# Patient Record
Sex: Female | Born: 1955 | Race: White | Hispanic: No | Marital: Married | Smoking: Former smoker
Health system: Southern US, Community
[De-identification: ages and names within clinical notes are randomized; demographics above are authoritative.]

## PROBLEM LIST (undated history)

## (undated) DIAGNOSIS — IMO0002 Reserved for concepts with insufficient information to code with codable children: Secondary | ICD-10-CM

## (undated) DIAGNOSIS — N281 Cyst of kidney, acquired: Secondary | ICD-10-CM

## (undated) DIAGNOSIS — S83249A Other tear of medial meniscus, current injury, unspecified knee, initial encounter: Secondary | ICD-10-CM

## (undated) DIAGNOSIS — K449 Diaphragmatic hernia without obstruction or gangrene: Secondary | ICD-10-CM

## (undated) DIAGNOSIS — A64 Unspecified sexually transmitted disease: Secondary | ICD-10-CM

## (undated) HISTORY — DX: Cyst of kidney, acquired: N28.1

## (undated) HISTORY — DX: Other tear of medial meniscus, current injury, unspecified knee, initial encounter: S83.249A

## (undated) HISTORY — DX: Diaphragmatic hernia without obstruction or gangrene: K44.9

## (undated) HISTORY — PX: BREAST CYST EXCISION: SHX579

## (undated) HISTORY — DX: Reserved for concepts with insufficient information to code with codable children: IMO0002

## (undated) HISTORY — DX: Unspecified sexually transmitted disease: A64

---

## 1998-02-03 HISTORY — PX: COLPOSCOPY: SHX161

## 1998-05-03 DIAGNOSIS — IMO0002 Reserved for concepts with insufficient information to code with codable children: Secondary | ICD-10-CM

## 1998-05-03 DIAGNOSIS — R87619 Unspecified abnormal cytological findings in specimens from cervix uteri: Secondary | ICD-10-CM

## 1998-05-03 HISTORY — DX: Reserved for concepts with insufficient information to code with codable children: IMO0002

## 1998-05-03 HISTORY — DX: Unspecified abnormal cytological findings in specimens from cervix uteri: R87.619

## 1999-04-04 ENCOUNTER — Encounter: Payer: Self-pay | Admitting: Obstetrics and Gynecology

## 1999-04-04 ENCOUNTER — Encounter: Admission: RE | Admit: 1999-04-04 | Discharge: 1999-04-04 | Payer: Self-pay | Admitting: Obstetrics and Gynecology

## 2000-04-15 ENCOUNTER — Encounter: Payer: Self-pay | Admitting: Unknown Physician Specialty

## 2000-04-15 ENCOUNTER — Encounter: Admission: RE | Admit: 2000-04-15 | Discharge: 2000-04-15 | Payer: Self-pay | Admitting: Obstetrics and Gynecology

## 2001-04-16 ENCOUNTER — Encounter: Admission: RE | Admit: 2001-04-16 | Discharge: 2001-04-16 | Payer: Self-pay

## 2002-09-15 ENCOUNTER — Encounter: Admission: RE | Admit: 2002-09-15 | Discharge: 2002-09-15 | Payer: Self-pay | Admitting: Unknown Physician Specialty

## 2002-09-15 ENCOUNTER — Encounter: Payer: Self-pay | Admitting: Unknown Physician Specialty

## 2003-09-27 ENCOUNTER — Encounter: Admission: RE | Admit: 2003-09-27 | Discharge: 2003-09-27 | Payer: Self-pay | Admitting: Unknown Physician Specialty

## 2004-10-16 ENCOUNTER — Encounter: Admission: RE | Admit: 2004-10-16 | Discharge: 2004-10-16 | Payer: Self-pay | Admitting: Unknown Physician Specialty

## 2004-12-10 ENCOUNTER — Encounter: Admission: RE | Admit: 2004-12-10 | Discharge: 2004-12-10 | Payer: Self-pay | Admitting: Family Medicine

## 2005-06-18 ENCOUNTER — Encounter: Admission: RE | Admit: 2005-06-18 | Discharge: 2005-06-18 | Payer: Self-pay | Admitting: Unknown Physician Specialty

## 2005-11-05 ENCOUNTER — Encounter: Admission: RE | Admit: 2005-11-05 | Discharge: 2005-11-05 | Payer: Self-pay | Admitting: Unknown Physician Specialty

## 2006-11-18 ENCOUNTER — Encounter: Admission: RE | Admit: 2006-11-18 | Discharge: 2006-11-18 | Payer: Self-pay | Admitting: Obstetrics and Gynecology

## 2006-12-05 DIAGNOSIS — N281 Cyst of kidney, acquired: Secondary | ICD-10-CM

## 2006-12-05 HISTORY — DX: Cyst of kidney, acquired: N28.1

## 2006-12-10 ENCOUNTER — Other Ambulatory Visit: Admission: RE | Admit: 2006-12-10 | Discharge: 2006-12-10 | Payer: Self-pay | Admitting: Obstetrics & Gynecology

## 2006-12-18 ENCOUNTER — Encounter: Admission: RE | Admit: 2006-12-18 | Discharge: 2006-12-18 | Payer: Self-pay | Admitting: Obstetrics and Gynecology

## 2006-12-27 ENCOUNTER — Encounter: Admission: RE | Admit: 2006-12-27 | Discharge: 2006-12-27 | Payer: Self-pay | Admitting: Obstetrics and Gynecology

## 2007-06-30 ENCOUNTER — Encounter: Admission: RE | Admit: 2007-06-30 | Discharge: 2007-06-30 | Payer: Self-pay | Admitting: Urology

## 2007-11-24 ENCOUNTER — Encounter: Admission: RE | Admit: 2007-11-24 | Discharge: 2007-11-24 | Payer: Self-pay | Admitting: Obstetrics and Gynecology

## 2007-12-16 ENCOUNTER — Other Ambulatory Visit: Admission: RE | Admit: 2007-12-16 | Discharge: 2007-12-16 | Payer: Self-pay | Admitting: Obstetrics and Gynecology

## 2008-01-12 ENCOUNTER — Encounter: Admission: RE | Admit: 2008-01-12 | Discharge: 2008-01-12 | Payer: Self-pay | Admitting: Obstetrics and Gynecology

## 2008-06-29 ENCOUNTER — Encounter: Admission: RE | Admit: 2008-06-29 | Discharge: 2008-06-29 | Payer: Self-pay | Admitting: Urology

## 2008-11-24 ENCOUNTER — Encounter: Admission: RE | Admit: 2008-11-24 | Discharge: 2008-11-24 | Payer: Self-pay | Admitting: Obstetrics and Gynecology

## 2009-01-26 ENCOUNTER — Emergency Department (HOSPITAL_BASED_OUTPATIENT_CLINIC_OR_DEPARTMENT_OTHER): Admission: EM | Admit: 2009-01-26 | Discharge: 2009-01-26 | Payer: Self-pay | Admitting: Emergency Medicine

## 2009-01-26 ENCOUNTER — Ambulatory Visit: Payer: Self-pay | Admitting: Diagnostic Radiology

## 2009-02-03 DIAGNOSIS — K449 Diaphragmatic hernia without obstruction or gangrene: Secondary | ICD-10-CM

## 2009-02-03 HISTORY — DX: Diaphragmatic hernia without obstruction or gangrene: K44.9

## 2009-07-23 ENCOUNTER — Encounter: Admission: RE | Admit: 2009-07-23 | Discharge: 2009-07-23 | Payer: Self-pay | Admitting: Urology

## 2009-10-03 ENCOUNTER — Encounter: Admission: RE | Admit: 2009-10-03 | Discharge: 2009-10-03 | Payer: Self-pay | Admitting: Orthopedic Surgery

## 2009-10-10 ENCOUNTER — Encounter: Admission: RE | Admit: 2009-10-10 | Discharge: 2009-10-10 | Payer: Self-pay | Admitting: Orthopedic Surgery

## 2009-10-26 IMAGING — MG MM SCREEN MAMMOGRAM BILATERAL
4 series · 4 of 4 positions shown · non-contrast
Comparison: none

DG SCREEN MAMMOGRAM BILATERAL
Bilateral CC and MLO view(s) were taken.

DIGITAL SCREENING MAMMOGRAM WITH CAD:
There are scattered fibroglandular densities.  No masses or malignant type calcifications are 
identified.  Compared with prior studies.
Images were processed with CAD.

[R CC]
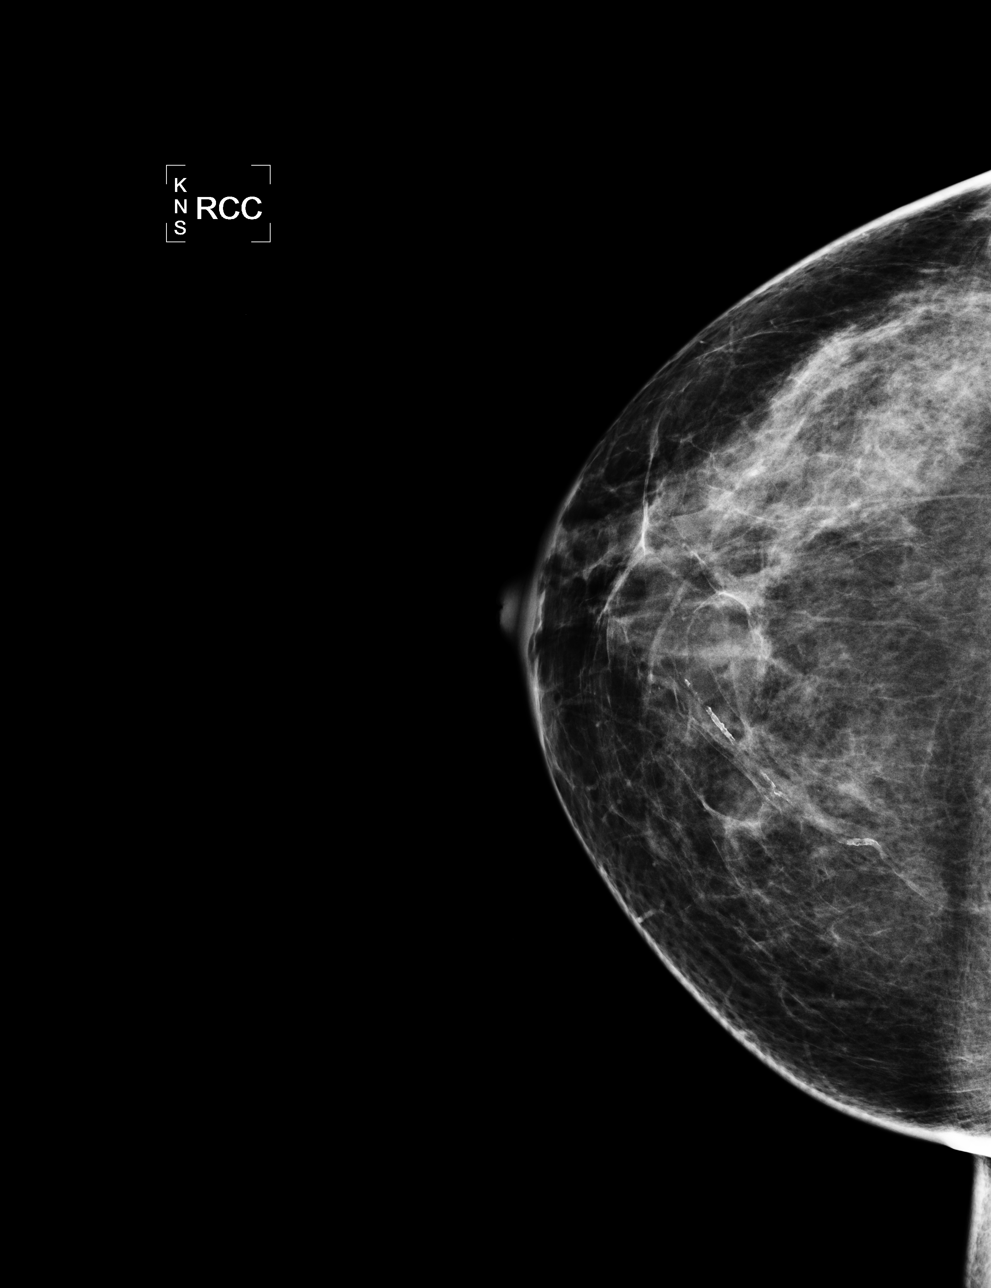

[L CC]
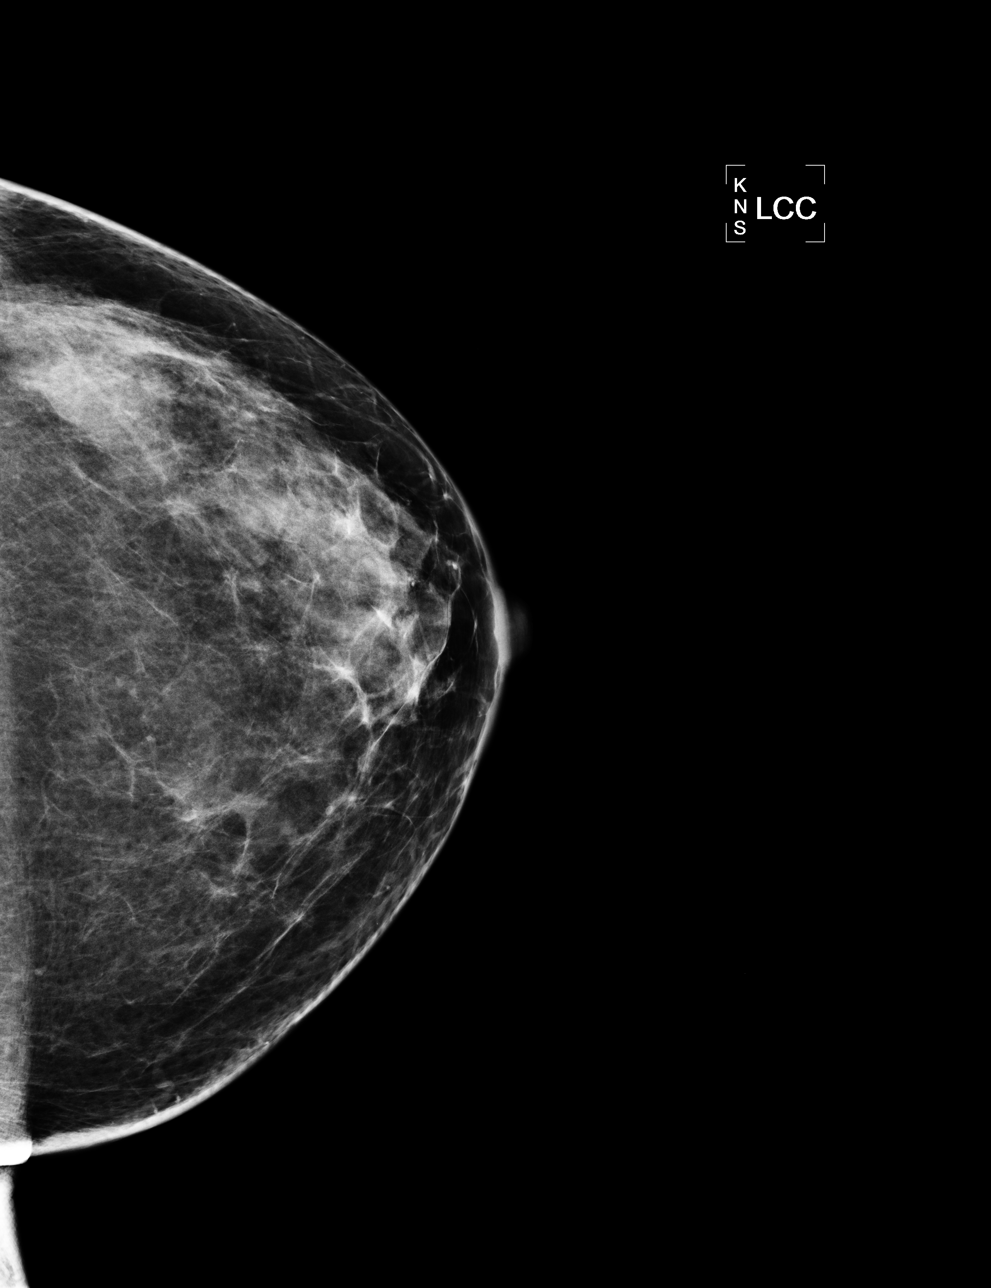

[L MLO]
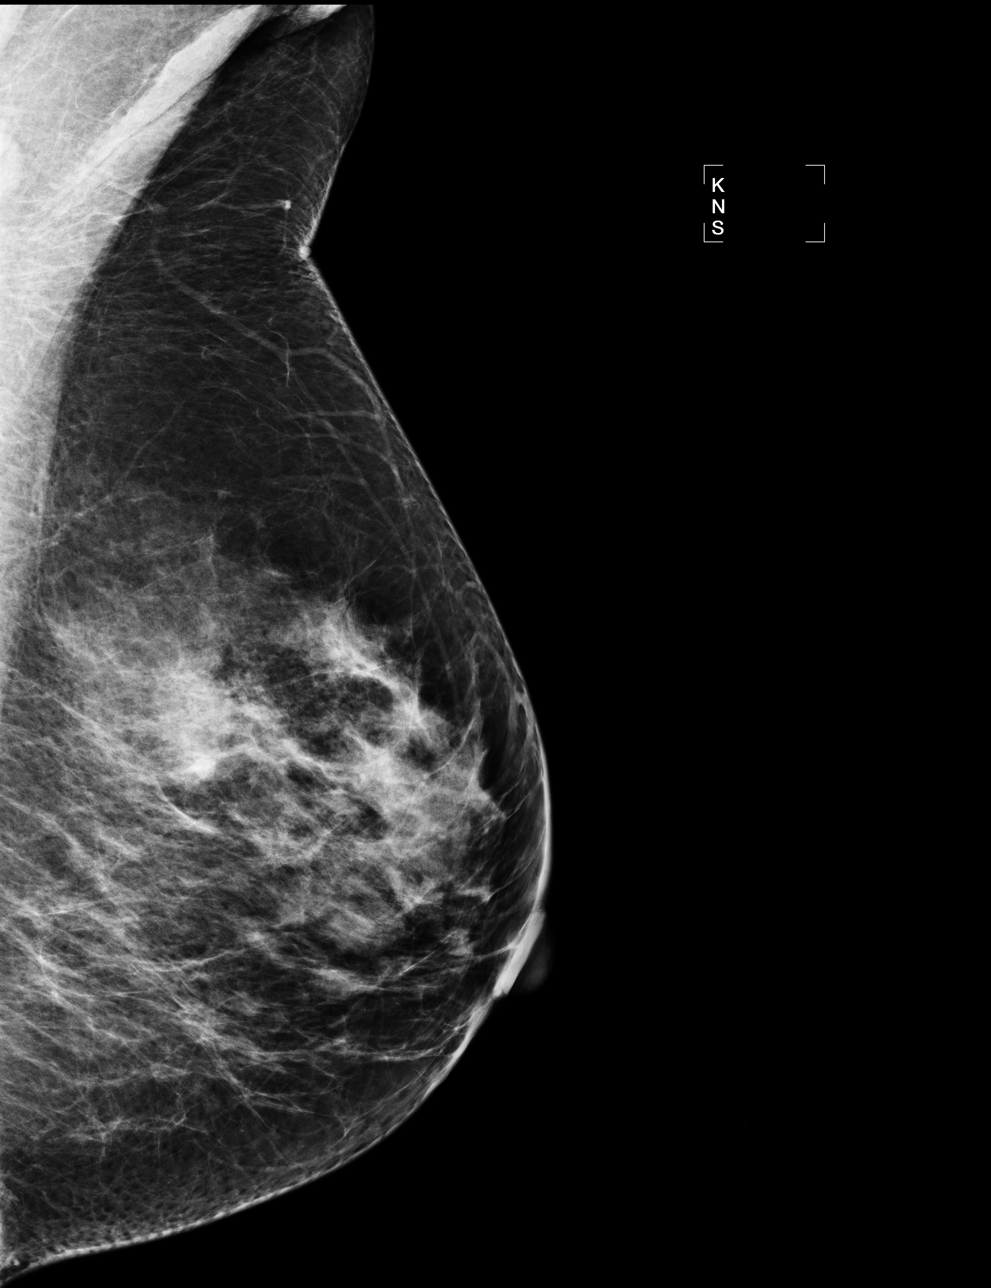

[R MLO]
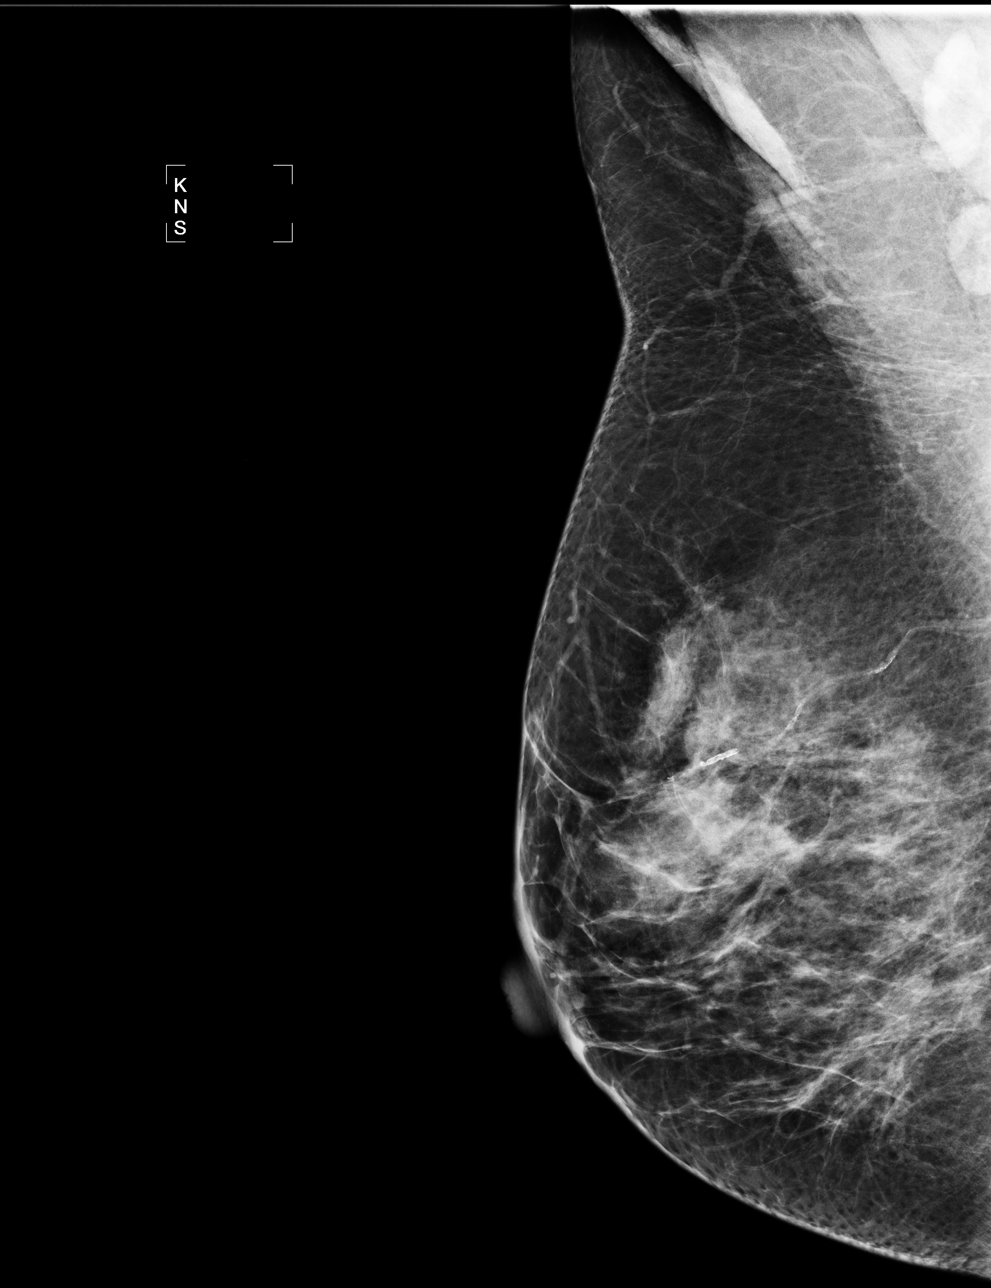

[4 of 4 positions shown; findings below may reference images not displayed]

IMPRESSION: No specific mammographic evidence of malignancy.  Next screening mammogram is recommended in one 
year.

A result letter of this screening mammogram will be mailed directly to the patient.

ASSESSMENT: Negative - BI-RADS 1

Screening mammogram in 1 year.
,

## 2009-10-29 ENCOUNTER — Encounter
Admission: RE | Admit: 2009-10-29 | Discharge: 2009-10-29 | Payer: Self-pay | Source: Home / Self Care | Admitting: Neurosurgery

## 2009-11-26 ENCOUNTER — Encounter
Admission: RE | Admit: 2009-11-26 | Discharge: 2009-11-26 | Payer: Self-pay | Source: Home / Self Care | Admitting: Obstetrics and Gynecology

## 2009-12-26 ENCOUNTER — Encounter
Admission: RE | Admit: 2009-12-26 | Discharge: 2009-12-26 | Payer: Self-pay | Source: Home / Self Care | Admitting: Neurosurgery

## 2010-02-20 ENCOUNTER — Encounter
Admission: RE | Admit: 2010-02-20 | Discharge: 2010-02-20 | Payer: Self-pay | Source: Home / Self Care | Attending: Neurosurgery | Admitting: Neurosurgery

## 2010-06-28 ENCOUNTER — Other Ambulatory Visit: Payer: Self-pay | Admitting: Neurosurgery

## 2010-06-28 DIAGNOSIS — M549 Dorsalgia, unspecified: Secondary | ICD-10-CM

## 2010-07-04 ENCOUNTER — Ambulatory Visit
Admission: RE | Admit: 2010-07-04 | Discharge: 2010-07-04 | Disposition: A | Discharge status: Home / Self Care | Payer: PRIVATE HEALTH INSURANCE | Source: Ambulatory Visit | Attending: Neurosurgery | Admitting: Neurosurgery

## 2010-07-04 ENCOUNTER — Other Ambulatory Visit: Payer: Self-pay | Admitting: Neurosurgery

## 2010-07-04 DIAGNOSIS — M549 Dorsalgia, unspecified: Secondary | ICD-10-CM

## 2010-10-22 ENCOUNTER — Other Ambulatory Visit: Payer: Self-pay | Admitting: Obstetrics and Gynecology

## 2010-10-22 DIAGNOSIS — Z1231 Encounter for screening mammogram for malignant neoplasm of breast: Secondary | ICD-10-CM

## 2010-12-03 ENCOUNTER — Ambulatory Visit
Admission: RE | Admit: 2010-12-03 | Discharge: 2010-12-03 | Disposition: A | Discharge status: Home / Self Care | Payer: PRIVATE HEALTH INSURANCE | Source: Ambulatory Visit | Attending: Obstetrics and Gynecology | Admitting: Obstetrics and Gynecology

## 2010-12-03 DIAGNOSIS — Z1231 Encounter for screening mammogram for malignant neoplasm of breast: Secondary | ICD-10-CM

## 2010-12-25 ENCOUNTER — Other Ambulatory Visit: Payer: Self-pay | Admitting: Neurosurgery

## 2010-12-25 DIAGNOSIS — M47817 Spondylosis without myelopathy or radiculopathy, lumbosacral region: Secondary | ICD-10-CM

## 2011-01-03 ENCOUNTER — Other Ambulatory Visit: Payer: PRIVATE HEALTH INSURANCE

## 2011-01-24 ENCOUNTER — Ambulatory Visit
Admission: RE | Admit: 2011-01-24 | Discharge: 2011-01-24 | Disposition: A | Discharge status: Home / Self Care | Payer: PRIVATE HEALTH INSURANCE | Source: Ambulatory Visit | Attending: Neurosurgery | Admitting: Neurosurgery

## 2011-01-24 ENCOUNTER — Other Ambulatory Visit: Payer: Self-pay | Admitting: Neurosurgery

## 2011-01-24 DIAGNOSIS — M47817 Spondylosis without myelopathy or radiculopathy, lumbosacral region: Secondary | ICD-10-CM

## 2011-01-24 MED ORDER — METHYLPREDNISOLONE ACETATE 40 MG/ML INJ SUSP (RADIOLOG
120.0000 mg | Freq: Once | INTRAMUSCULAR | Status: AC
  Administered 2011-01-24: 120 mg via EPIDURAL

## 2011-01-24 MED ORDER — IOHEXOL 180 MG/ML  SOLN
1.0000 mL | Freq: Once | INTRAMUSCULAR | Status: AC | PRN
  Administered 2011-01-24: 1 mL via EPIDURAL

## 2011-11-03 ENCOUNTER — Other Ambulatory Visit: Payer: Self-pay | Admitting: Certified Nurse Midwife

## 2011-11-03 DIAGNOSIS — Z1231 Encounter for screening mammogram for malignant neoplasm of breast: Secondary | ICD-10-CM

## 2011-12-04 ENCOUNTER — Ambulatory Visit (INDEPENDENT_AMBULATORY_CARE_PROVIDER_SITE_OTHER): Payer: PRIVATE HEALTH INSURANCE

## 2011-12-04 DIAGNOSIS — Z1231 Encounter for screening mammogram for malignant neoplasm of breast: Secondary | ICD-10-CM

## 2012-06-10 ENCOUNTER — Telehealth: Payer: Self-pay | Admitting: Certified Nurse Midwife

## 2012-06-10 NOTE — Telephone Encounter (Signed)
Patient cancel fasting lipid panel appointment for 07/15/12. Patient states she will get this done at her next [facility] 02/08/13. FYI.Marland KitchenMarland Kitchen

## 2012-07-15 ENCOUNTER — Other Ambulatory Visit: Payer: Self-pay

## 2012-08-30 ENCOUNTER — Other Ambulatory Visit: Payer: Self-pay | Admitting: Orthopedic Surgery

## 2012-08-30 DIAGNOSIS — M549 Dorsalgia, unspecified: Secondary | ICD-10-CM

## 2012-08-31 ENCOUNTER — Other Ambulatory Visit: Payer: Self-pay | Admitting: Orthopedic Surgery

## 2012-08-31 ENCOUNTER — Ambulatory Visit
Admission: RE | Admit: 2012-08-31 | Discharge: 2012-08-31 | Disposition: A | Discharge status: Home / Self Care | Payer: PRIVATE HEALTH INSURANCE | Source: Ambulatory Visit | Attending: Orthopedic Surgery | Admitting: Orthopedic Surgery

## 2012-08-31 ENCOUNTER — Inpatient Hospital Stay: Admission: RE | Admit: 2012-08-31 | Payer: PRIVATE HEALTH INSURANCE | Source: Ambulatory Visit

## 2012-08-31 DIAGNOSIS — M549 Dorsalgia, unspecified: Secondary | ICD-10-CM

## 2012-08-31 MED ORDER — IOHEXOL 180 MG/ML  SOLN
1.0000 mL | Freq: Once | INTRAMUSCULAR | Status: AC | PRN
  Administered 2012-08-31: 1 mL via EPIDURAL

## 2012-08-31 MED ORDER — METHYLPREDNISOLONE ACETATE 40 MG/ML INJ SUSP (RADIOLOG
120.0000 mg | Freq: Once | INTRAMUSCULAR | Status: AC
  Administered 2012-08-31: 120 mg via EPIDURAL

## 2012-09-07 ENCOUNTER — Other Ambulatory Visit: Payer: PRIVATE HEALTH INSURANCE

## 2012-10-29 ENCOUNTER — Other Ambulatory Visit: Payer: Self-pay

## 2012-10-29 DIAGNOSIS — Z1231 Encounter for screening mammogram for malignant neoplasm of breast: Secondary | ICD-10-CM

## 2012-12-06 ENCOUNTER — Ambulatory Visit: Payer: Self-pay

## 2012-12-09 ENCOUNTER — Other Ambulatory Visit: Payer: Self-pay

## 2012-12-09 ENCOUNTER — Ambulatory Visit
Admission: RE | Admit: 2012-12-09 | Discharge: 2012-12-09 | Disposition: A | Discharge status: Home / Self Care | Payer: PRIVATE HEALTH INSURANCE | Source: Ambulatory Visit

## 2012-12-09 DIAGNOSIS — Z1231 Encounter for screening mammogram for malignant neoplasm of breast: Secondary | ICD-10-CM

## 2013-02-07 ENCOUNTER — Encounter: Payer: Self-pay | Admitting: Certified Nurse Midwife

## 2013-02-08 ENCOUNTER — Encounter: Payer: Self-pay | Admitting: Certified Nurse Midwife

## 2013-02-08 ENCOUNTER — Ambulatory Visit (INDEPENDENT_AMBULATORY_CARE_PROVIDER_SITE_OTHER): Payer: No Typology Code available for payment source | Admitting: Certified Nurse Midwife

## 2013-02-08 VITALS — BP 111/66 | HR 69 | Resp 16 | Ht 61.5 in | Wt 135.0 lb

## 2013-02-08 DIAGNOSIS — Z0000 Encounter for general adult medical examination without abnormal findings: Secondary | ICD-10-CM

## 2013-02-08 DIAGNOSIS — Z01419 Encounter for gynecological examination (general) (routine) without abnormal findings: Secondary | ICD-10-CM

## 2013-02-08 LAB — COMPREHENSIVE METABOLIC PANEL
ALK PHOS: 67 U/L (ref 39–117)
ALT: 12 U/L (ref 0–35)
AST: 16 U/L (ref 0–37)
Albumin: 4 g/dL (ref 3.5–5.2)
BILIRUBIN TOTAL: 0.4 mg/dL (ref 0.3–1.2)
BUN: 13 mg/dL (ref 6–23)
CO2: 29 meq/L (ref 19–32)
Calcium: 8.9 mg/dL (ref 8.4–10.5)
Chloride: 106 mEq/L (ref 96–112)
Creat: 0.53 mg/dL (ref 0.50–1.10)
GLUCOSE: 86 mg/dL (ref 70–99)
Potassium: 4 mEq/L (ref 3.5–5.3)
Sodium: 142 mEq/L (ref 135–145)
Total Protein: 6.2 g/dL (ref 6.0–8.3)

## 2013-02-08 LAB — POCT URINALYSIS DIPSTICK
BILIRUBIN UA: NEGATIVE
Glucose, UA: NEGATIVE
Ketones, UA: NEGATIVE
LEUKOCYTES UA: NEGATIVE
NITRITE UA: NEGATIVE
PH UA: 5
PROTEIN UA: NEGATIVE
UROBILINOGEN UA: NEGATIVE

## 2013-02-08 LAB — LIPID PANEL
Cholesterol: 221 mg/dL — ABNORMAL HIGH (ref 0–200)
HDL: 56 mg/dL (ref >39)
LDL Cholesterol: 137 mg/dL — ABNORMAL HIGH (ref 0–99)
TRIGLYCERIDES: 139 mg/dL (ref <150)
Total CHOL/HDL Ratio: 3.9 Ratio
VLDL: 28 mg/dL (ref 0–40)

## 2013-02-08 LAB — TSH: TSH: 1.324 u[IU]/mL (ref 0.350–4.500)

## 2013-02-08 LAB — HEMOGLOBIN, FINGERSTICK: Hemoglobin, fingerstick: 13 g/dL (ref 12.0–16.0)

## 2013-02-08 LAB — HEMOGLOBIN A1C
HEMOGLOBIN A1C: 5.7 % — AB (ref <5.7)
MEAN PLASMA GLUCOSE: 117 mg/dL — AB (ref <117)

## 2013-02-08 NOTE — Patient Instructions (Signed)
General topics  Next pap or exam is  due in 1 year Take a Women's multivitamin Take 1200 mg. of calcium daily - prefer dietary If any concerns in interim to call back  Breast Self-Awareness Practicing breast self-awareness may pick up problems early, prevent significant medical complications, and possibly save your life. By practicing breast self-awareness, you can become familiar with how your breasts look and feel and if your breasts are changing. This allows you to notice changes early. It can also offer you some reassurance that your breast health is good. One way to learn what is normal for your breasts and whether your breasts are changing is to do a breast self-exam. If you find a lump or something that was not present in the past, it is best to contact your caregiver right away. Other findings that should be evaluated by your caregiver include nipple discharge, especially if it is bloody; skin changes or reddening; areas where the skin seems to be pulled in (retracted); or new lumps and bumps. Breast pain is seldom associated with cancer (malignancy), but should also be evaluated by a caregiver. BREAST SELF-EXAM The best time to examine your breasts is 5 7 days after your menstrual period is over.  ExitCare Patient Information 2013 [employer].   Exercise to Stay Healthy Exercise helps you become and stay healthy. EXERCISE IDEAS AND TIPS Choose exercises that:  You enjoy.  Fit into your day. You do not need to exercise really hard to be healthy. You can do exercises at a slow or medium level and stay healthy. You can:  Stretch before and after working out.  Try yoga, Pilates, or tai chi.  Lift weights.  Walk fast, swim, jog, run, climb stairs, bicycle, dance, or rollerskate.  Take aerobic classes. Exercises that burn about 150 calories:  Running 1  miles in 15 minutes.  Playing volleyball for 45 to 60 minutes.  Washing and waxing a car for 45 to 60  minutes.  Playing touch football for 45 minutes.  Walking 1  miles in 35 minutes.  Pushing a stroller 1  miles in 30 minutes.  Playing basketball for 30 minutes.  Raking leaves for 30 minutes.  Bicycling 5 miles in 30 minutes.  Walking 2 miles in 30 minutes.  Dancing for 30 minutes.  Shoveling snow for 15 minutes.  Swimming laps for 20 minutes.  Walking up stairs for 15 minutes.  Bicycling 4 miles in 15 minutes.  Gardening for 30 to 45 minutes.  Jumping rope for 15 minutes.  Washing windows or floors for 45 to 60 minutes. Document Released: 02/22/2010 Document Revised: 04/14/2011 Document Reviewed: 02/22/2010 ExitCare Patient Information 2013 ExitCare[employer].   Other topics ( that may be useful information):    Sexually Transmitted Disease Sexually transmitted disease (STD) refers to any infection that is passed from person to person during sexual activity. This may happen by way of saliva, semen, blood, vaginal mucus, or urine. Common STDs include:  Gonorrhea.  Chlamydia.  Syphilis.  HIV/AIDS.  Genital herpes.  Hepatitis B and C.  Trichomonas.  Human papillomavirus (HPV).  Pubic lice. CAUSES  An STD may be spread by bacteria, virus, or parasite. A person can get an STD by:  Sexual intercourse with an infected person.  Sharing sex toys with an infected person.  Sharing needles with an infected person.  Having intimate contact with the genitals, mouth, or rectal areas of an infected person. SYMPTOMS  Some people may not have any symptoms, but   they can still pass the infection to others. Different STDs have different symptoms. Symptoms include:  Painful or bloody urination.  Pain in the pelvis, abdomen, vagina, anus, throat, or eyes.  Skin rash, itching, irritation, growths, or sores (lesions). These usually occur in the genital or anal area.  Abnormal vaginal discharge.  Penile discharge in men.  Soft, flesh-colored skin growths in the  genital or anal area.  Fever.  Pain or bleeding during sexual intercourse.  Swollen glands in the groin area.  Yellow skin and eyes (jaundice). This is seen with hepatitis. DIAGNOSIS  To make a diagnosis, your caregiver may:  Take a medical history.  Perform a physical exam.  Take a specimen (culture) to be examined.  Examine a sample of discharge under a microscope.  Perform blood test TREATMENT   Chlamydia, gonorrhea, trichomonas, and syphilis can be cured with antibiotic medicine.  Genital herpes, hepatitis, and HIV can be treated, but not cured, with prescribed medicines. The medicines will lessen the symptoms.  Genital warts from HPV can be treated with medicine or by freezing, burning (electrocautery), or surgery. Warts may come back.  HPV is a virus and cannot be cured with medicine or surgery.However, abnormal areas may be followed very closely by your caregiver and may be removed from the cervix, vagina, or vulva through office procedures or surgery. If your diagnosis is confirmed, your recent sexual partners need treatment. This is true even if they are symptom-free or have a negative culture or evaluation. They should not have sex until their caregiver says it is okay. HOME CARE INSTRUCTIONS  All sexual partners should be informed, tested, and treated for all STDs.  Take your antibiotics as directed. Finish them even if you start to feel better.  Only take over-the-counter or prescription medicines for pain, discomfort, or fever as directed by your caregiver.  Rest.  Eat a balanced diet and drink enough fluids to keep your urine clear or pale yellow.  Do not have sex until treatment is completed and you have followed up with your caregiver. STDs should be checked after treatment.  Keep all follow-up appointments, Pap tests, and blood tests as directed by your caregiver.  Only use latex condoms and water-soluble lubricants during sexual activity. Do not use  petroleum jelly or oils.  Avoid alcohol and illegal drugs.  Get vaccinated for HPV and hepatitis. If you have not received these vaccines in the past, talk to your caregiver about whether one or both might be right for you.  Avoid risky sex practices that can break the skin. The only way to avoid getting an STD is to avoid all sexual activity.Latex condoms and dental dams (for oral sex) will help lessen the risk of getting an STD, but will not completely eliminate the risk. SEEK MEDICAL CARE IF:   You have a fever.  You have any new or worsening symptoms. Document Released: 04/12/2002 Document Revised: 04/14/2011 Document Reviewed: 04/19/2010 [facility] Patient Information 2013 [employer].    Domestic Abuse You are being battered or abused if someone close to you hits, pushes, or physically hurts you in any way. You also are being abused if you are forced into activities. You are being sexually abused if you are forced to have sexual contact of any kind. You are being emotionally abused if you are made to feel worthless or if you are constantly threatened. It is important to remember that help is available. No one has the right to abuse you. PREVENTION OF FURTHER  ABUSE  Learn the warning signs of danger. This varies with situations but may include: the use of alcohol, threats, isolation from friends and family, or forced sexual contact. Leave if you feel that violence is going to occur.  If you are attacked or beaten, report it to the police so the abuse is documented. You do not have to press charges. The police can protect you while you or the attackers are leaving. Get the officer's name and badge number and a copy of the report.  Find someone you can trust and tell them what is happening to you: your caregiver, a nurse, clergy member, close friend or family member. Feeling ashamed is natural, but remember that you have done nothing wrong. No one deserves abuse. Document Released:  01/18/2000 Document Revised: 04/14/2011 Document Reviewed: 03/28/2010 ExitCare Patient Information 2013 [employer].    How Much is Too Much Alcohol? Drinking too much alcohol can cause injury, accidents, and health problems. These types of problems can include:   Car crashes.  Falls.  Family fighting (domestic violence).  Drowning.  Fights.  Injuries.  Burns.  Damage to certain organs.  Having a baby with birth defects. ONE DRINK CAN BE TOO MUCH WHEN YOU ARE:  Working.  Pregnant or breastfeeding.  Taking medicines. Ask your doctor.  Driving or planning to drive. If you or someone you know has a drinking problem, get help from a doctor.  Document Released: 11/16/2008 Document Revised: 04/14/2011 Document Reviewed: 11/16/2008 ExitCare Patient Information 2013 [employer].   Smoking Hazards Smoking cigarettes is extremely bad for your health. Tobacco smoke has over 200 known poisons in it. There are over 60 chemicals in tobacco smoke that cause cancer. Some of the chemicals found in cigarette smoke include:   Cyanide.  Benzene.  Formaldehyde.  Methanol (wood alcohol).  Acetylene (fuel used in welding torches).  Ammonia. Cigarette smoke also contains the poisonous gases nitrogen oxide and carbon monoxide.  Cigarette smokers have an increased risk of many serious medical problems and Smoking causes approximately:  90% of all lung cancer deaths in men.  80% of all lung cancer deaths in women.  90% of deaths from chronic obstructive lung disease. Compared with nonsmokers, smoking increases the risk of:  Coronary heart disease by 2 to 4 times.  Stroke by 2 to 4 times.  Men developing lung cancer by 23 times.  Women developing lung cancer by 13 times.  Dying from chronic obstructive lung diseases by 12 times.  . Smoking is the most preventable cause of death and disease in our society.  WHY IS SMOKING ADDICTIVE?  Nicotine is the chemical  agent in tobacco that is capable of causing addiction or dependence.  When you smoke and inhale, nicotine is absorbed rapidly into the bloodstream through your lungs. Nicotine absorbed through the lungs is capable of creating a powerful addiction. Both inhaled and non-inhaled nicotine may be addictive.  Addiction studies of cigarettes and spit tobacco show that addiction to nicotine occurs mainly during the teen years, when young people begin using tobacco products. WHAT ARE THE BENEFITS OF QUITTING?  There are many health benefits to quitting smoking.   Likelihood of developing cancer and heart disease decreases. Health improvements are seen almost immediately.  Blood pressure, pulse rate, and breathing patterns start returning to normal soon after quitting. QUITTING SMOKING   American Lung Association - 1-800-LUNGUSA  American Cancer Society - 1-800-ACS-2345 Document Released: 02/28/2004 Document Revised: 04/14/2011 Document Reviewed: 11/01/2008 ExitCare Patient Information 2013 ExitCare,   LLC.   Stress Management Stress is a state of physical or mental tension that often results from changes in your life or normal routine. Some common causes of stress are:  Death of a loved one.  Injuries or severe illnesses.  Getting fired or changing jobs.  Moving into a new home. Other causes may be:  Sexual problems.  Business or financial losses.  Taking on a large debt.  Regular conflict with someone at home or at work.  Constant tiredness from lack of sleep. It is not just bad things that are stressful. It may be stressful to:  Win the lottery.  Get married.  Buy a new car. The amount of stress that can be easily tolerated varies from person to person. Changes generally cause stress, regardless of the types of change. Too much stress can affect your health. It may lead to physical or emotional problems. Too little stress (boredom) may also become stressful. SUGGESTIONS TO  REDUCE STRESS:  Talk things over with your family and friends. It often is helpful to share your concerns and worries. If you feel your problem is serious, you may want to get help from a professional counselor.  Consider your problems one at a time instead of lumping them all together. Trying to take care of everything at once may seem impossible. List all the things you need to do and then start with the most important one. Set a goal to accomplish 2 or 3 things each day. If you expect to do too many in a single day you will naturally fail, causing you to feel even more stressed.  Do not use alcohol or drugs to relieve stress. Although you may feel better for a short time, they do not remove the problems that caused the stress. They can also be habit forming.  Exercise regularly - at least 3 times per week. Physical exercise can help to relieve that "uptight" feeling and will relax you.  The shortest distance between despair and hope is often a good night's sleep.  Go to bed and get up on time allowing yourself time for appointments without being rushed.  Take a short "time-out" period from any stressful situation that occurs during the day. Close your eyes and take some deep breaths. Starting with the muscles in your face, tense them, hold it for a few seconds, then relax. Repeat this with the muscles in your neck, shoulders, hand, stomach, back and legs.  Take good care of yourself. Eat a balanced diet and get plenty of rest.  Schedule time for having fun. Take a break from your daily routine to relax. HOME CARE INSTRUCTIONS   Call if you feel overwhelmed by your problems and feel you can no longer manage them on your own.  Return immediately if you feel like hurting yourself or someone else. Document Released: 07/16/2000 Document Revised: 04/14/2011 Document Reviewed: 03/08/2007 ExitCare Patient Information 2013 ExitCare, [employer].   

## 2013-02-08 NOTE — Progress Notes (Signed)
58 y.o. G35P1001 Married Caucasian Fe here for annual exam.  Menopausal no HRT. Denies vaginal bleeding or vaginal dryness. Uses Olive Oil if needed. Sees urgent care if needed. No health issues today. Son at [facility] with a 4.0 this year so far!  Patient's last menstrual period was 02/04/2004.          Sexually active: yes  The current method of family planning is vasectomy.    Exercising: no  exercising Smoker:  no  Health Maintenance: Pap:  01-06-12 neg HPV HR neg MMG:  12-09-12 normal Colonoscopy:  11/08 neg BMD:   2009 TDaP:  2007 Labs: Poct urine-rbc tr, Hgb-13.0 Self breast exam: done occ   reports that she has quit smoking. She does not have any smokeless tobacco history on file. She reports that she drinks about 1.0 ounces of alcohol per week. She reports that she does not use illicit drugs.  Past Medical History  Diagnosis Date  . Abnormal Pap smear 05/03/98    ASCUS  . STD (sexually transmitted disease)     HSV2  . Kidney cysts 11/08    no f/u needed (left side)  . Hiatal hernia 1/11    Past Surgical History  Procedure Laterality Date  . Colposcopy  2000    atypia, no dysplasia  . Breast cyst excision Right     benign (fibroadenoma per pt)    Current Outpatient Prescriptions  Medication Sig Dispense Refill  . Vitamin D, Ergocalciferol, (DRISDOL) 50000 UNITS CAPS capsule Take 50,000 Units by mouth every 14 (fourteen) days.       No current facility-administered medications for this visit.    Family History  Problem Relation Age of Onset  . Hypertension Father   . Stroke Father     ROS:  Pertinent items are noted in HPI.  Otherwise, a comprehensive ROS was negative.  Exam:   BP 111/66  Pulse 69  Resp 16  Ht 5' 1.5" (1.562 m)  Wt 135 lb (61.236 kg)  BMI 25.10 kg/m2  LMP 02/04/2004 Height: 5' 1.5" (156.2 cm)  Ht Readings from Last 3 Encounters:  02/08/13 5' 1.5" (1.562 m)    General appearance: alert, cooperative and appears stated  age Head: Normocephalic, without obvious abnormality, atraumatic Neck: no adenopathy, supple, symmetrical, trachea midline and thyroid normal to inspection and palpation and non-palpable Lungs: clear to auscultation bilaterally Breasts: normal appearance, no masses or tenderness, No nipple retraction or dimpling, No nipple discharge or bleeding, No axillary or supraclavicular adenopathy Heart: regular rate and rhythm Abdomen: soft, non-tender; no masses,  no organomegaly Extremities: extremities normal, atraumatic, no cyanosis or edema Skin: Skin color, texture, turgor normal. No rashes or lesions Lymph nodes: Cervical, supraclavicular, and axillary nodes normal. No abnormal inguinal nodes palpated Neurologic: Grossly normal   Pelvic: External genitalia:  no lesions              Urethra:  normal appearing urethra with no masses, tenderness or lesions              Bartholin's and Skene's: normal                 Vagina: normal appearing vagina with normal color and discharge, no lesions              Cervix: normal, non tender              Pap taken: no Bimanual Exam:  Uterus:  normal size, contour, position, consistency, mobility, non-tender and anteflexed  Adnexa: normal adnexa and no mass, fullness, tenderness               Rectovaginal: Confirms               Anus:  normal sphincter tone, no lesions  A:  Well Woman with normal exam  Menopausal no HRT  History of kidney cyst released from follow up  Fasting labs  P:   Reviewed health and wellness pertinent to exam  Aware of need to evaluate if vaginal bleeding  Patient will return to urology if changes  Labs: CMP, Lipid panel, TSH, Hgb A1-c  Pap smear as per guidelines   Mammogram yearly pap smear not taken today  counseled on breast self exam, mammography screening, adequate intake of calcium and vitamin D, diet and exercise, Kegel's exercises  return annually or prn  An After Visit Summary was printed and given  to the patient.

## 2013-02-08 NOTE — Progress Notes (Signed)
Reviewed personally.  M. Suzanne Khalessi Blough, MD.  

## 2013-02-10 ENCOUNTER — Encounter: Payer: Self-pay | Admitting: Certified Nurse Midwife

## 2013-02-10 ENCOUNTER — Other Ambulatory Visit: Payer: Self-pay | Admitting: Certified Nurse Midwife

## 2013-02-10 DIAGNOSIS — R6889 Other general symptoms and signs: Secondary | ICD-10-CM

## 2013-02-23 LAB — FECAL OCCULT BLOOD, IMMUNOCHEMICAL: IFOBT: NEGATIVE

## 2013-05-09 ENCOUNTER — Telehealth: Payer: Self-pay | Admitting: Certified Nurse Midwife

## 2013-05-09 NOTE — Telephone Encounter (Signed)
Patient canceled upcoming lab appointment 05/26/13. Patient says she does not wish to reschedule.

## 2013-05-10 NOTE — Telephone Encounter (Signed)
As long as patient is aware. Will close encounter

## 2013-05-10 NOTE — Telephone Encounter (Signed)
Pt states she is not going to have it checked until January. I explained the risk to patient & why we wanted it checked it 3 mths & pt still says that even if she was told that she needed medication that she would not take it & she wants to take charge of it herself & work on decreasing it & doesn't want it checked until January of 2016

## 2013-05-10 NOTE — Telephone Encounter (Signed)
Please call patient and see if she is having this followed elsewhere? Or just needs to reschedule Her level was borderline high

## 2013-05-26 ENCOUNTER — Other Ambulatory Visit: Payer: No Typology Code available for payment source

## 2013-08-10 ENCOUNTER — Other Ambulatory Visit: Payer: Self-pay | Admitting: Orthopedic Surgery

## 2013-08-10 DIAGNOSIS — M5126 Other intervertebral disc displacement, lumbar region: Secondary | ICD-10-CM

## 2013-08-15 ENCOUNTER — Other Ambulatory Visit: Payer: No Typology Code available for payment source

## 2013-08-25 ENCOUNTER — Other Ambulatory Visit: Payer: Self-pay | Admitting: Orthopedic Surgery

## 2013-08-25 ENCOUNTER — Other Ambulatory Visit: Payer: No Typology Code available for payment source

## 2013-08-25 ENCOUNTER — Ambulatory Visit
Admission: RE | Admit: 2013-08-25 | Discharge: 2013-08-25 | Disposition: A | Discharge status: Home / Self Care | Payer: PRIVATE HEALTH INSURANCE | Source: Ambulatory Visit | Attending: Orthopedic Surgery | Admitting: Orthopedic Surgery

## 2013-08-25 DIAGNOSIS — M5126 Other intervertebral disc displacement, lumbar region: Secondary | ICD-10-CM

## 2013-08-25 MED ORDER — METHYLPREDNISOLONE ACETATE 40 MG/ML INJ SUSP (RADIOLOG
120.0000 mg | Freq: Once | INTRAMUSCULAR | Status: AC
  Administered 2013-08-25: 120 mg via EPIDURAL

## 2013-08-25 MED ORDER — IOHEXOL 180 MG/ML  SOLN
1.0000 mL | Freq: Once | INTRAMUSCULAR | Status: AC | PRN
  Administered 2013-08-25: 1 mL via EPIDURAL

## 2013-08-25 NOTE — Discharge Instructions (Signed)

## 2013-10-20 ENCOUNTER — Other Ambulatory Visit: Payer: Self-pay

## 2013-10-20 DIAGNOSIS — Z1231 Encounter for screening mammogram for malignant neoplasm of breast: Secondary | ICD-10-CM

## 2013-12-05 ENCOUNTER — Encounter: Payer: Self-pay | Admitting: Certified Nurse Midwife

## 2013-12-16 ENCOUNTER — Ambulatory Visit: Payer: PRIVATE HEALTH INSURANCE

## 2013-12-19 ENCOUNTER — Ambulatory Visit: Payer: PRIVATE HEALTH INSURANCE

## 2013-12-19 ENCOUNTER — Ambulatory Visit
Admission: RE | Admit: 2013-12-19 | Discharge: 2013-12-19 | Disposition: A | Discharge status: Home / Self Care | Payer: PRIVATE HEALTH INSURANCE | Source: Ambulatory Visit

## 2013-12-19 ENCOUNTER — Other Ambulatory Visit: Payer: Self-pay

## 2013-12-19 DIAGNOSIS — Z1231 Encounter for screening mammogram for malignant neoplasm of breast: Secondary | ICD-10-CM

## 2014-02-17 ENCOUNTER — Ambulatory Visit (INDEPENDENT_AMBULATORY_CARE_PROVIDER_SITE_OTHER): Payer: No Typology Code available for payment source | Admitting: Certified Nurse Midwife

## 2014-02-17 ENCOUNTER — Encounter: Payer: Self-pay | Admitting: Certified Nurse Midwife

## 2014-02-17 VITALS — BP 104/70 | HR 76 | Resp 16 | Ht 61.5 in | Wt 135.0 lb

## 2014-02-17 DIAGNOSIS — Z0000 Encounter for general adult medical examination without abnormal findings: Secondary | ICD-10-CM

## 2014-02-17 DIAGNOSIS — Z124 Encounter for screening for malignant neoplasm of cervix: Secondary | ICD-10-CM

## 2014-02-17 DIAGNOSIS — Z01419 Encounter for gynecological examination (general) (routine) without abnormal findings: Secondary | ICD-10-CM

## 2014-02-17 LAB — LIPID PANEL
CHOL/HDL RATIO: 3.5 ratio
CHOLESTEROL: 221 mg/dL — AB (ref 0–200)
HDL: 63 mg/dL (ref >39)
LDL CALC: 138 mg/dL — AB (ref 0–99)
TRIGLYCERIDES: 99 mg/dL (ref <150)
VLDL: 20 mg/dL (ref 0–40)

## 2014-02-17 LAB — POCT URINALYSIS DIPSTICK
BILIRUBIN UA: NEGATIVE
GLUCOSE UA: NEGATIVE
KETONES UA: NEGATIVE
Leukocytes, UA: NEGATIVE
NITRITE UA: NEGATIVE
PROTEIN UA: NEGATIVE
RBC UA: NEGATIVE
UROBILINOGEN UA: NEGATIVE
pH, UA: 5

## 2014-02-17 LAB — CBC
HCT: 41.6 % (ref 36.0–46.0)
HEMOGLOBIN: 13.6 g/dL (ref 12.0–15.0)
MCH: 31.3 pg (ref 26.0–34.0)
MCHC: 32.7 g/dL (ref 30.0–36.0)
MCV: 95.6 fL (ref 78.0–100.0)
MPV: 9.1 fL (ref 8.6–12.4)
PLATELETS: 289 10*3/uL (ref 150–400)
RBC: 4.35 MIL/uL (ref 3.87–5.11)
RDW: 12.9 % (ref 11.5–15.5)
WBC: 6 10*3/uL (ref 4.0–10.5)

## 2014-02-17 LAB — COMPREHENSIVE METABOLIC PANEL
ALT: 16 U/L (ref 0–35)
AST: 19 U/L (ref 0–37)
Albumin: 4.2 g/dL (ref 3.5–5.2)
Alkaline Phosphatase: 63 U/L (ref 39–117)
BILIRUBIN TOTAL: 0.4 mg/dL (ref 0.2–1.2)
BUN: 15 mg/dL (ref 6–23)
CALCIUM: 9.7 mg/dL (ref 8.4–10.5)
CHLORIDE: 104 meq/L (ref 96–112)
CO2: 28 meq/L (ref 19–32)
Creat: 0.56 mg/dL (ref 0.50–1.10)
Glucose, Bld: 77 mg/dL (ref 70–99)
Potassium: 3.9 mEq/L (ref 3.5–5.3)
SODIUM: 142 meq/L (ref 135–145)
Total Protein: 6.8 g/dL (ref 6.0–8.3)

## 2014-02-17 LAB — TSH: TSH: 1.088 u[IU]/mL (ref 0.350–4.500)

## 2014-02-17 LAB — HEMOGLOBIN A1C
Hgb A1c MFr Bld: 5.4 % (ref <5.7)
MEAN PLASMA GLUCOSE: 108 mg/dL (ref <117)

## 2014-02-17 NOTE — Progress Notes (Signed)
Reviewed personally.  M. Suzanne Bently Morath, MD.  

## 2014-02-17 NOTE — Patient Instructions (Signed)

## 2014-02-17 NOTE — Progress Notes (Signed)
59 y.o. G38P1011 Married  Caucasian Fe here for annual exam. Menopausal no HRT. Denies vaginal bleeding. Occasional vaginal dryness. Sees Urgent Care if needed. Fasted for labs today. No health issues today.  Patient's last menstrual period was 02/04/2004.          Sexually active: Yes.    The current method of family planning is vasectomy.    Exercising: No.  exercise Smoker:  no  Health Maintenance: Pap:  01-06-12 neg HPV HR neg MMG:  12-19-13 category c density,birads 1:neg Colonoscopy:  11/08 neg BMD:   2009 TDaP:  2007 Labs:poct urine-neg Self breast exam: done occ   reports that she has quit smoking. She does not have any smokeless tobacco history on file. She reports that she drinks about 1.8 - 2.4 oz of alcohol per week. She reports that she does not use illicit drugs.  Past Medical History  Diagnosis Date  . Abnormal Pap smear 05/03/98    ASCUS  . STD (sexually transmitted disease)     HSV2  . Kidney cysts 11/08    no f/u needed (left side)  . Hiatal hernia 1/11    Past Surgical History  Procedure Laterality Date  . Colposcopy  2000    atypia, no dysplasia  . Breast cyst excision Right     benign (fibroadenoma per pt)    Current Outpatient Prescriptions  Medication Sig Dispense Refill  . Cholecalciferol (VITAMIN D PO) Take 1,000 Int'l Units by mouth daily.     . Multiple Vitamins-Minerals (MULTIVITAMIN PO) Take by mouth daily.     No current facility-administered medications for this visit.    Family History  Problem Relation Age of Onset  . Hypertension Father   . Stroke Father     ROS:  Pertinent items are noted in HPI.  Otherwise, a comprehensive ROS was negative.  Exam:   BP 104/70 mmHg  Pulse 76  Resp 16  Ht 5' 1.5" (1.562 m)  Wt 135 lb (61.236 kg)  BMI 25.10 kg/m2  LMP 02/04/2004 Height: 5' 1.5" (156.2 cm) Ht Readings from Last 3 Encounters:  02/17/14 5' 1.5" (1.562 m)  02/08/13 5' 1.5" (1.562 m)    General appearance: alert, cooperative  and appears stated age Head: Normocephalic, without obvious abnormality, atraumatic Neck: no adenopathy, supple, symmetrical, trachea midline and thyroid normal to inspection and palpation Lungs: clear to auscultation bilaterally Breasts: normal appearance, no masses or tenderness, No nipple retraction or dimpling, No nipple discharge or bleeding, No axillary or supraclavicular adenopathy Heart: regular rate and rhythm Abdomen: soft, non-tender; no masses,  no organomegaly Extremities: extremities normal, atraumatic, no cyanosis or edema Skin: Skin color, texture, turgor normal. No rashes or lesions Lymph nodes: Cervical, supraclavicular, and axillary nodes normal. No abnormal inguinal nodes palpated Neurologic: Grossly normal   Pelvic: External genitalia:  no lesions              Urethra:  normal appearing urethra with no masses, tenderness or lesions              Bartholin's and Skene's: normal                 Vagina: normal appearing vagina with normal color and discharge, no lesions              Cervix: normal,non tender, no lesions              Pap taken: Yes.   Bimanual Exam:  Uterus:  normal size, contour, position, consistency,  mobility, non-tender and anteverted              Adnexa: normal adnexa and no mass, fullness, tenderness               Rectovaginal: Confirms               Anus:  normal sphincter tone, no lesions  Chaperone present: Yes  A:  Well Woman with normal exam  Menopausal  No HRT  Fasting labs  P:   Reviewed health and wellness pertinent to exam  Aware of need to advise  If vaginal bleeding  Pap smear taken today with HPV reflex  Labs: CBC, Vit.D, TSH, Lipid panel, CMP, Hgb A1-c   counseled on breast self exam, mammography screening, adequate intake of calcium and vitamin D, diet and exercise  return annually or prn  An After Visit Summary was printed and given to the patient.

## 2014-02-18 LAB — VITAMIN D 25 HYDROXY (VIT D DEFICIENCY, FRACTURES): VIT D 25 HYDROXY: 36 ng/mL (ref 30–100)

## 2014-02-21 LAB — IPS PAP TEST WITH REFLEX TO HPV

## 2014-04-03 ENCOUNTER — Telehealth: Payer: Self-pay | Admitting: Certified Nurse Midwife

## 2014-04-03 NOTE — Telephone Encounter (Signed)
Debbi, is this something you would prescribe?

## 2014-04-03 NOTE — Telephone Encounter (Signed)
Patient calling to request "four alprazolam tablets altogether at the lowest dose." She is taking a flight 04/10/14 "for the first time in 20 years" and said she is "nervous."  [employer]  [phone]

## 2014-04-04 MED ORDER — ALPRAZOLAM 0.5 MG PO TABS: ORAL_TABLET | ORAL | Status: DC

## 2014-04-04 NOTE — Telephone Encounter (Signed)
Rx printed, signed by Ms. Debbie and faxed to [place] , patient is aware.

## 2014-04-04 NOTE — Telephone Encounter (Signed)
Rx printed for signature. 

## 2014-04-04 NOTE — Telephone Encounter (Signed)
Ok to do Rx Xanax 0.5mg  one prior to flight, prn for travel  Disp: # 10

## 2014-11-08 ENCOUNTER — Other Ambulatory Visit: Payer: Self-pay

## 2014-11-08 DIAGNOSIS — Z1231 Encounter for screening mammogram for malignant neoplasm of breast: Secondary | ICD-10-CM

## 2014-12-25 ENCOUNTER — Ambulatory Visit
Admission: RE | Admit: 2014-12-25 | Discharge: 2014-12-25 | Disposition: A | Discharge status: Home / Self Care | Payer: PRIVATE HEALTH INSURANCE | Source: Ambulatory Visit

## 2014-12-25 DIAGNOSIS — Z1231 Encounter for screening mammogram for malignant neoplasm of breast: Secondary | ICD-10-CM

## 2015-02-22 ENCOUNTER — Ambulatory Visit: Payer: No Typology Code available for payment source | Admitting: Certified Nurse Midwife

## 2015-02-23 ENCOUNTER — Encounter: Payer: Self-pay | Admitting: Certified Nurse Midwife

## 2015-02-23 ENCOUNTER — Ambulatory Visit (INDEPENDENT_AMBULATORY_CARE_PROVIDER_SITE_OTHER): Payer: No Typology Code available for payment source | Admitting: Certified Nurse Midwife

## 2015-02-23 VITALS — BP 102/68 | HR 68 | Resp 16 | Ht 61.75 in | Wt 132.0 lb

## 2015-02-23 DIAGNOSIS — Z0000 Encounter for general adult medical examination without abnormal findings: Secondary | ICD-10-CM | POA: Diagnosis not present

## 2015-02-23 DIAGNOSIS — Z01419 Encounter for gynecological examination (general) (routine) without abnormal findings: Secondary | ICD-10-CM | POA: Diagnosis not present

## 2015-02-23 DIAGNOSIS — F410 Panic disorder [episodic paroxysmal anxiety] without agoraphobia: Secondary | ICD-10-CM

## 2015-02-23 LAB — LIPID PANEL
CHOL/HDL RATIO: 3.5 ratio (ref <=5.0)
CHOLESTEROL: 214 mg/dL — AB (ref 125–200)
HDL: 61 mg/dL (ref >=46)
LDL Cholesterol: 135 mg/dL — ABNORMAL HIGH (ref <130)
TRIGLYCERIDES: 92 mg/dL (ref <150)
VLDL: 18 mg/dL (ref <30)

## 2015-02-23 LAB — POCT URINALYSIS DIPSTICK
BILIRUBIN UA: NEGATIVE
GLUCOSE UA: NEGATIVE
KETONES UA: NEGATIVE
Leukocytes, UA: NEGATIVE
Nitrite, UA: NEGATIVE
Protein, UA: NEGATIVE
RBC UA: NEGATIVE
UROBILINOGEN UA: NEGATIVE
pH, UA: 5

## 2015-02-23 LAB — CBC
HEMATOCRIT: 39.5 % (ref 36.0–46.0)
HEMOGLOBIN: 13 g/dL (ref 12.0–15.0)
MCH: 31.6 pg (ref 26.0–34.0)
MCHC: 32.9 g/dL (ref 30.0–36.0)
MCV: 95.9 fL (ref 78.0–100.0)
MPV: 8.9 fL (ref 8.6–12.4)
Platelets: 295 10*3/uL (ref 150–400)
RBC: 4.12 MIL/uL (ref 3.87–5.11)
RDW: 12.7 % (ref 11.5–15.5)
WBC: 5.2 10*3/uL (ref 4.0–10.5)

## 2015-02-23 LAB — HEMOGLOBIN, FINGERSTICK: HEMOGLOBIN, FINGERSTICK: 12.6 g/dL (ref 12.0–16.0)

## 2015-02-23 LAB — THYROID PANEL WITH TSH
Free Thyroxine Index: 3.2 (ref 1.4–3.8)
T3 Uptake: 33 % (ref 22–35)
T4 TOTAL: 9.7 ug/dL (ref 4.5–12.0)
TSH: 0.903 u[IU]/mL (ref 0.350–4.500)

## 2015-02-23 MED ORDER — ALPRAZOLAM 0.5 MG PO TABS: ORAL_TABLET | ORAL | Status: DC

## 2015-02-23 NOTE — Patient Instructions (Signed)

## 2015-02-23 NOTE — Progress Notes (Signed)
60 y.o. G82P1011 Married  Caucasian Fe here for annual exam. Denies vaginal bleeding. Some vaginal dryness with sexual activity now.Using OTC lubricant with fair results. Sees PCP prn. Staying very busy with work with increased work hours now at 24. 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 Considering retirement or a "fun job". Complaining of increase heat and cold intolerance, no change in hair or skin or nails. Some fatigue, but feel work related. Eats well. No other health concerns today. Fasted for screening labs. Will junior in college now!  Patient's last menstrual period was 02/04/2004.          Sexually active: Yes.    The current method of family planning is vasectomy.    Exercising: No.  exercise Smoker:  no  Health Maintenance: Pap:  02-17-14 neg MMG:  12-25-14 category b density,birads 1:neg Colonoscopy:  11/08 neg, pt declines further colonoscopy BMD:   2009 TDaP:  2007 Shingles: 11-12-10 Pneumonia: no Hep C and HIV: not done Labs: poct urine-neg, hgb-12.6 Self breast exam: done occ   reports that she has quit smoking. She does not have any smokeless tobacco history on file. She reports that she drinks about 3.0 oz of alcohol per week. She reports that she does not use illicit drugs.  Past Medical History  Diagnosis Date  . Abnormal Pap smear 05/03/98    ASCUS  . STD (sexually transmitted disease)     HSV2  . Kidney cysts 11/08    no f/u needed (left side)  . Hiatal hernia 1/11    Past Surgical History  Procedure Laterality Date  . Colposcopy  2000    atypia, no dysplasia  . Breast cyst excision Right     benign (fibroadenoma per pt)    Current Outpatient Prescriptions  Medication Sig Dispense Refill  . Cholecalciferol (VITAMIN D PO) Take 1,000 Int'l Units by mouth daily.     . Multiple Vitamins-Minerals (MULTIVITAMIN PO) Take by mouth daily.    Marland Kitchen ALPRAZolam (XANAX) 0.5 MG tablet Take one tablet po prior to flight and as  needed for travel. (Patient not taking: Reported on 02/23/2015) 10 tablet 0   No current facility-administered medications for this visit.    Family History  Problem Relation Age of Onset  . Hypertension Father   . Stroke Father     ROS:  Pertinent items are noted in HPI.  Otherwise, a comprehensive ROS was negative.  Exam:   BP 102/68 mmHg  Pulse 68  Resp 16  Ht 5' 1.75" (1.568 m)  Wt 132 lb (59.875 kg)  BMI 24.35 kg/m2  LMP 02/04/2004 Height: 5' 1.75" (156.8 cm) Ht Readings from Last 3 Encounters:  02/23/15 5' 1.75" (1.568 m)  02/17/14 5' 1.5" (1.562 m)  02/08/13 5' 1.5" (1.562 m)    General appearance: alert, cooperative and appears stated age Head: Normocephalic, without obvious abnormality, atraumatic Neck: no adenopathy, supple, symmetrical, trachea midline and thyroid normal to inspection and palpation Lungs: clear to auscultation bilaterally Breasts: normal appearance, no masses or tenderness, No nipple retraction or dimpling, No nipple discharge or bleeding, No axillary or supraclavicular adenopathy Heart: regular rate and rhythm Abdomen: soft, non-tender; no masses,  no organomegaly Extremities: extremities normal, atraumatic, no cyanosis or edema Skin: Skin color, texture, turgor normal. No rashes or lesions Lymph nodes: Cervical, supraclavicular, and axillary nodes normal. No abnormal inguinal nodes palpated Neurologic: Grossly normal   Pelvic: External genitalia:  no lesions  Urethra:  normal appearing urethra with no masses, tenderness or lesions              Bartholin's and Skene's: normal                 Vagina: normal appearing vagina with normal color and discharge, no lesions              Cervix: normal,non tender, no lesions              Pap taken: No. Bimanual Exam:  Uterus:  normal size, contour, position, consistency, mobility, non-tender              Adnexa: normal adnexa and no mass, fullness, tenderness               Rectovaginal:  Confirms               Anus:  normal sphincter tone, no lesions, small hemorrhoid not thrombosed or tender noted(patient aware, treating with OTC med. Working well)  Chaperone present: yes  A:  Well Woman with normal exam  Menopausal no HRT  Vaginal dryness  Screening labs  Flying anxiety/panic Xanax worked well for use. Would like update of RX.  Heat/cold intolerance change  P:   Reviewed health and wellness pertinent to exam  Aware of need to evaluate if vaginal bleeding  Discussed coconut oil with applicator use and with sexual activity also. Patient uses coconut oil for cooking, so will try. Will advise if no change.  Labs: CBC, Hep C, Lipid panel, Thyroid panel with TSH Vitamin D  Discussed only will prescribe small amount due to limited use.( patient used 4 in one year.  Rx Xanax see order  Encouraged to be make sure she is eating and drinking adequately which can change tolerance level. Will check TSH to see if problems.   Pap smear as above not done  counseled on breast self exam, mammography screening, adequate intake of calcium and vitamin D, diet and exercise, Kegel's exercises  return annually or prn  An After Visit Summary was printed and given to the patient.

## 2015-02-24 LAB — HEPATITIS C ANTIBODY: HCV AB: NEGATIVE

## 2015-02-24 LAB — VITAMIN D 25 HYDROXY (VIT D DEFICIENCY, FRACTURES): Vit D, 25-Hydroxy: 38 ng/mL (ref 30–100)

## 2015-02-26 NOTE — Progress Notes (Signed)
Reviewed personally.  M. Suzanne Rosilyn Coachman, MD.  

## 2015-02-28 ENCOUNTER — Telehealth: Payer: Self-pay

## 2015-02-28 NOTE — Telephone Encounter (Signed)
Patient notified of results. See lab 

## 2015-02-28 NOTE — Telephone Encounter (Signed)
lmtcb

## 2015-02-28 NOTE — Telephone Encounter (Signed)
-----   Message from Regina Eck, CNM sent at 02/28/2015  7:55 AM EST ----- Notify patient Hep. C is negative Vitamin D is 38 borderline normal, Would encourage her to take 800 to 1000 IU Vitamin D 3 OTC daily to maintain TSH and panel normal Cholesterol 214 down from 221 last year LDL is 135 down from 138 last year HDL is good  Continue to work on regular exercise and less stress! CBC is normal Recheck with next aex

## 2015-10-11 ENCOUNTER — Other Ambulatory Visit: Payer: Self-pay | Admitting: Certified Nurse Midwife

## 2015-10-11 DIAGNOSIS — Z1231 Encounter for screening mammogram for malignant neoplasm of breast: Secondary | ICD-10-CM

## 2015-11-29 ENCOUNTER — Telehealth: Payer: Self-pay | Admitting: Certified Nurse Midwife

## 2015-11-29 ENCOUNTER — Ambulatory Visit (INDEPENDENT_AMBULATORY_CARE_PROVIDER_SITE_OTHER): Payer: No Typology Code available for payment source | Admitting: Certified Nurse Midwife

## 2015-11-29 ENCOUNTER — Encounter: Payer: Self-pay | Admitting: Certified Nurse Midwife

## 2015-11-29 VITALS — BP 120/70 | HR 72 | Resp 16 | Ht 61.75 in | Wt 130.0 lb

## 2015-11-29 DIAGNOSIS — N390 Urinary tract infection, site not specified: Secondary | ICD-10-CM

## 2015-11-29 LAB — POCT URINALYSIS DIPSTICK
Bilirubin, UA: NEGATIVE
Blood, UA: NEGATIVE
Glucose, UA: NEGATIVE
KETONES UA: NEGATIVE
LEUKOCYTES UA: NEGATIVE
Nitrite, UA: NEGATIVE
PH UA: 5
PROTEIN UA: NEGATIVE
UROBILINOGEN UA: NEGATIVE

## 2015-11-29 MED ORDER — NITROFURANTOIN MONOHYD MACRO 100 MG PO CAPS: 100.0000 mg | ORAL_CAPSULE | Freq: Two times a day (BID) | ORAL | 0 refills | Status: DC

## 2015-11-29 NOTE — Progress Notes (Signed)
60 y.o. Married Caucasian female G2P1011 here with complaint of UTI, with onset  on 2-3 days. Patient complaining of urinary frequency/urgency/ and pain with urination. Patient denies fever, chills, nausea or back pain. No new personal products. Patient feels not related to sexual activity. Denies any vaginal symptoms.   Menopausal with slight vaginal dryness. Has not been using coconut oil recently. Patient is consuming adequate water intake. Has been taking cranberrry tablets with some relief, but has increased this am. No other health issues today. Retiring in 6 months!   O: Healthy female WDWN Affect: Normal, orientation x 3 Skin : warm and dry CVAT: negative bilateral Abdomen: positive for suprapubic tenderness  Pelvic exam: External genital area: normal, no lesions Bladder,Urethra tender, Urethral meatus: tender, red, dry appearance Vagina: normal vaginal discharge, normal appearance, introital area slight atrophic appearance    Cervix: normal, non tender Uterus:normal,non tender Adnexa: normal non tender, no fullness or masses   A: UTI vs Cystitis, symptomatic Normal pelvic exam poct urine-neg P: Reviewed findings with patient and etiology. Discussed vaginal dryness and increase risk of occurrence of UTI/cystitis. Encouraged to start with coconut oil at least twice weekly, patient will try.  [id number] see order with instructions [id number] Reviewed warning signs and symptoms of UTI and need to advise if occurring. Encouraged to limit soda, tea, and coffee and be sure to increase water intake.   RV prn

## 2015-11-29 NOTE — Patient Instructions (Signed)

## 2015-11-30 NOTE — Progress Notes (Signed)
Encounter reviewed Warnell Rasnic, MD   

## 2015-12-12 NOTE — Telephone Encounter (Signed)
Note not needed 

## 2015-12-31 ENCOUNTER — Ambulatory Visit
Admission: RE | Admit: 2015-12-31 | Discharge: 2015-12-31 | Disposition: A | Discharge status: Home / Self Care | Payer: PRIVATE HEALTH INSURANCE | Source: Ambulatory Visit | Attending: Certified Nurse Midwife | Admitting: Certified Nurse Midwife

## 2015-12-31 DIAGNOSIS — Z1231 Encounter for screening mammogram for malignant neoplasm of breast: Secondary | ICD-10-CM

## 2016-02-26 ENCOUNTER — Ambulatory Visit: Payer: No Typology Code available for payment source | Admitting: Certified Nurse Midwife

## 2016-03-04 NOTE — Progress Notes (Signed)
61 y.o. G82P1011 Married  Caucasian Fe here for annual exam. Menopausal no HRT. Denies vaginal bleeding or vaginal dryness, that is a problem. Sees PCP prn. Has now decided to work prn starting 05-03-16. Spouse very supportive of decision.  No health issues today. Son now graduating from college. Screening labs if needed. Plans travel again and will need Xanax refill for flying.  Patient's last menstrual period was 02/04/2004.          Sexually active: Yes.    The current method of family planning is post menopausal status.    Exercising: No.  The patient does not participate in regular exercise at present. Smoker:  no  Health Maintenance: Pap:  02-17-14 neg MMG:  12-31-15 density b birads 1:neg Colonoscopy:  11/08 neg, pt declines further colonoscopy BMD:   2009 TDaP:  2007 Shingles: 2012 Pneumonia: Never Hep C and HIV: Hep c neg 2017 Labs:      Urine: Trace RBC 8pH Self breast exam: Yes   reports that she has quit smoking. She has never used smokeless tobacco. She reports that she drinks about 2.4 oz of alcohol per week . She reports that she does not use drugs.  Past Medical History:  Diagnosis Date  . Abnormal Pap smear 05/03/98   ASCUS  . Hiatal hernia 1/11  . Kidney cysts 11/08   no f/u needed (left side)  . STD (sexually transmitted disease)    HSV2    Past Surgical History:  Procedure Laterality Date  . BREAST CYST EXCISION Right    benign (fibroadenoma per pt)  . COLPOSCOPY  2000   atypia, no dysplasia    Current Outpatient Prescriptions  Medication Sig Dispense Refill  . ALPRAZolam (XANAX) 0.5 MG tablet Take one tablet po prior to flight and as needed for travel. 10 tablet 0  . Ascorbic Acid (VITAMIN C PO) Take by mouth.    . Cholecalciferol (VITAMIN D PO) Take 1,000 Int'l Units by mouth daily.     Marland Kitchen MELATONIN PO Take by mouth at bedtime.    . Multiple Vitamins-Minerals (MULTIVITAMIN PO) Take by mouth daily.     No current facility-administered medications for  this visit.     Family History  Problem Relation Age of Onset  . Hypertension Father   . Stroke Father     ROS:  Pertinent items are noted in HPI.  Otherwise, a comprehensive ROS was negative.  Exam:   BP 108/67 (BP Location: Right Arm, Patient Position: Sitting, Cuff Size: Normal)   Pulse 71   Resp 12   Ht 5' 1.25" (1.556 m)   Wt 129 lb 6.4 oz (58.7 kg)   LMP 02/04/2004   BMI 24.25 kg/m  Height: 5' 1.25" (155.6 cm) Ht Readings from Last 3 Encounters:  03/05/16 5' 1.25" (1.556 m)  11/29/15 5' 1.75" (1.568 m)  02/23/15 5' 1.75" (1.568 m)    General appearance: alert, cooperative and appears stated age Head: Normocephalic, without obvious abnormality, atraumatic Neck: no adenopathy, supple, symmetrical, trachea midline and thyroid normal to inspection and palpation Lungs: clear to auscultation bilaterally Breasts: normal appearance, no masses or tenderness, No nipple retraction or dimpling, No nipple discharge or bleeding, No axillary or supraclavicular adenopathy Heart: regular rate and rhythm Abdomen: soft, non-tender; no masses,  no organomegaly Extremities: extremities normal, atraumatic, no cyanosis or edema Skin: Skin color, texture, turgor normal. No rashes or lesions Lymph nodes: Cervical, supraclavicular, and axillary nodes normal. No abnormal inguinal nodes palpated Neurologic: Grossly normal  Pelvic: External genitalia:  no lesions              Urethra:  normal appearing urethra with no masses, tenderness or lesions              Bartholin's and Skene's: normal                 Vagina: normal appearing vagina with normal color and discharge, no lesions              Cervix: multiparous appearance, no bleeding following Pap, no cervical motion tenderness and no lesions              Pap taken: Yes.   Bimanual Exam:  Uterus:  normal size, contour, position, consistency, mobility, non-tender              Adnexa: normal adnexa and no mass, fullness, tenderness                Rectovaginal: Confirms               Anus:  normal sphincter tone, no lesions  Chaperone present: yes  A:  Well Woman with normal exam  Menopausal no HRT  Vaginal dryness coconut oil working well  Height loss, BMD due  Immunization update  Screening labs  Panic attacks with flying, Xanax works well  P:   Reviewed health and wellness pertinent to exam  Aware of need to evaluate if vaginal bleeding  Instructions given for easier use, questions addressed. Encouraged to use nightly if dryness change.  Patient will schedule and call if needs order( she works there) discussed importance of calcium in diet/ Vitamin D and regular exercise, to avoid bone loss. Questions addressed.  Requests TDAP  [id number] panel , CMP, Vitamin D  Rx Xanax see order with no refills  Pap smear as above with HPVHR   counseled on breast self exam, mammography screening, osteoporosis, adequate intake of calcium and vitamin D, diet and exercise  return annually or prn  An After Visit Summary was printed and given to the patient.

## 2016-03-05 ENCOUNTER — Ambulatory Visit (INDEPENDENT_AMBULATORY_CARE_PROVIDER_SITE_OTHER): Payer: No Typology Code available for payment source | Admitting: Certified Nurse Midwife

## 2016-03-05 ENCOUNTER — Telehealth: Payer: Self-pay | Admitting: Certified Nurse Midwife

## 2016-03-05 ENCOUNTER — Encounter: Payer: Self-pay | Admitting: Certified Nurse Midwife

## 2016-03-05 VITALS — BP 108/67 | HR 71 | Resp 12 | Ht 61.25 in | Wt 129.4 lb

## 2016-03-05 DIAGNOSIS — Z0000 Encounter for general adult medical examination without abnormal findings: Secondary | ICD-10-CM

## 2016-03-05 DIAGNOSIS — Z23 Encounter for immunization: Secondary | ICD-10-CM

## 2016-03-05 DIAGNOSIS — Z01419 Encounter for gynecological examination (general) (routine) without abnormal findings: Secondary | ICD-10-CM

## 2016-03-05 DIAGNOSIS — F410 Panic disorder [episodic paroxysmal anxiety] without agoraphobia: Secondary | ICD-10-CM | POA: Diagnosis not present

## 2016-03-05 DIAGNOSIS — Z124 Encounter for screening for malignant neoplasm of cervix: Secondary | ICD-10-CM

## 2016-03-05 DIAGNOSIS — R29890 Loss of height: Secondary | ICD-10-CM | POA: Diagnosis not present

## 2016-03-05 LAB — LIPID PANEL
CHOLESTEROL: 196 mg/dL (ref <200)
HDL: 56 mg/dL (ref >50)
LDL Cholesterol: 113 mg/dL — ABNORMAL HIGH (ref <100)
Total CHOL/HDL Ratio: 3.5 Ratio (ref <5.0)
Triglycerides: 136 mg/dL (ref <150)
VLDL: 27 mg/dL (ref <30)

## 2016-03-05 LAB — COMPREHENSIVE METABOLIC PANEL
ALBUMIN: 3.9 g/dL (ref 3.6–5.1)
ALK PHOS: 57 U/L (ref 33–130)
ALT: 15 U/L (ref 6–29)
AST: 18 U/L (ref 10–35)
BILIRUBIN TOTAL: 0.4 mg/dL (ref 0.2–1.2)
BUN: 14 mg/dL (ref 7–25)
CALCIUM: 9.4 mg/dL (ref 8.6–10.4)
CO2: 27 mmol/L (ref 20–31)
Chloride: 107 mmol/L (ref 98–110)
Creat: 0.54 mg/dL (ref 0.50–0.99)
GLUCOSE: 88 mg/dL (ref 65–99)
POTASSIUM: 3.8 mmol/L (ref 3.5–5.3)
Sodium: 142 mmol/L (ref 135–146)
Total Protein: 6.4 g/dL (ref 6.1–8.1)

## 2016-03-05 LAB — POCT URINALYSIS DIPSTICK
BILIRUBIN UA: NEGATIVE
GLUCOSE UA: NEGATIVE
KETONES UA: NEGATIVE
LEUKOCYTES UA: NEGATIVE
Nitrite, UA: NEGATIVE
Protein, UA: NEGATIVE
Urobilinogen, UA: NEGATIVE
pH, UA: 8

## 2016-03-05 MED ORDER — ALPRAZOLAM 0.5 MG PO TABS: ORAL_TABLET | ORAL | 0 refills | Status: DC

## 2016-03-05 NOTE — Telephone Encounter (Addendum)
Patient was seen today and says that her prescription was sent to the wrong pharmacy. [facility]  @ [address] . [phone]

## 2016-03-05 NOTE — Telephone Encounter (Signed)
Patient returned call.  The refill is for alprazolam.

## 2016-03-05 NOTE — Telephone Encounter (Signed)
Left message to call back- ? Which RX patient is referring to? -eh

## 2016-03-05 NOTE — Telephone Encounter (Signed)
Spoke with patient and clarified which RX and pharmacy. She is needing the Xanax to go to [facility] in [place], [employer].

## 2016-03-05 NOTE — Progress Notes (Signed)
Encounter reviewed Jill Jertson, MD   

## 2016-03-05 NOTE — Patient Instructions (Signed)

## 2016-03-06 LAB — VITAMIN D 25 HYDROXY (VIT D DEFICIENCY, FRACTURES): VIT D 25 HYDROXY: 49 ng/mL (ref 30–100)

## 2016-03-07 LAB — IPS PAP TEST WITH REFLEX TO HPV

## 2016-03-17 ENCOUNTER — Telehealth: Payer: Self-pay | Admitting: Certified Nurse Midwife

## 2016-03-17 ENCOUNTER — Encounter: Payer: Self-pay | Admitting: Nurse Practitioner

## 2016-03-17 ENCOUNTER — Ambulatory Visit (INDEPENDENT_AMBULATORY_CARE_PROVIDER_SITE_OTHER): Payer: No Typology Code available for payment source | Admitting: Nurse Practitioner

## 2016-03-17 VITALS — BP 120/74 | HR 64 | Temp 98.1°F | Resp 18 | Ht 61.25 in | Wt 130.0 lb

## 2016-03-17 DIAGNOSIS — R3915 Urgency of urination: Secondary | ICD-10-CM

## 2016-03-17 DIAGNOSIS — R300 Dysuria: Secondary | ICD-10-CM

## 2016-03-17 DIAGNOSIS — R350 Frequency of micturition: Secondary | ICD-10-CM

## 2016-03-17 LAB — POCT URINALYSIS DIPSTICK
BILIRUBIN UA: NEGATIVE
Glucose, UA: NEGATIVE
KETONES UA: NEGATIVE
Nitrite, UA: NEGATIVE
PH UA: 6.5
Protein, UA: NEGATIVE
Urobilinogen, UA: NEGATIVE

## 2016-03-17 MED ORDER — NITROFURANTOIN MONOHYD MACRO 100 MG PO CAPS: ORAL_CAPSULE | ORAL | 0 refills | Status: DC

## 2016-03-17 NOTE — Patient Instructions (Signed)
Will send results of C&S

## 2016-03-17 NOTE — Telephone Encounter (Signed)
Left message to call Journe Hallmark at [phone]. 

## 2016-03-17 NOTE — Telephone Encounter (Signed)
Spoke with patient. Patient states that she is having vaginal irritation, urinary urgency, and decrease urine output when using the restroom. Denies any lower back pain, fever, or chills. Patient was last seen for UTI symptoms on 11/29/2015. Advised patient she will need to be seen for further evaluation and treatment. Patient declines. "Last time I was given Macrobid and it worked. I can't afford to miss work and then have to pay for the office visit." Advised patient of having urine check as Macrobid may not treat bacteria that is causing symptoms this time. Different bacteria reqire different antibiotics. Patient again declines office visit requesting I speak with a provider. Advised I will speak with provider and return call with further recommendations. Patient is agreeable.  Routing to Kem Boroughs, FNP for review and advise as Melvia Heaps CNM is out of the office today.

## 2016-03-17 NOTE — Progress Notes (Signed)
Patient ID: Nicole Crawford, female   DOB: [birth year], 61 y.o.   MRN: [id number]  60 y.o.Married Caucasian female G2P1011 here with complaint of UTI, with onset a few days ago. Patient complaining of:  dysuria, urinary frequency and urinary urgency. Patient denies fever, chills, nausea or back pain. No new personal products. Patient feels not related to sexual activity.   But after husband prostate cancer surgery they did have SA ~ 2 weeks ago. Denies vaginal symptoms.    Contraception is postmenopausal.  Menopausal with vaginal dryness. Patient has adequate water intake.  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   O: Healthy female WDWN Affect: Normal, orientation x 3 Skin : warm and dry CVAT: negative bilateral Abdomen: negative for suprapubic tenderness  Pelvic exam: External genital area: normal, no lesions Bladder,Urethra tender, Urethral meatus: prolapsed and red Vagina: normal vaginal discharge, normal appearance Cervix: normal, non tender Uterus:normal,non tender Adnexa: normal non tender, no fullness or masses  POCT: trace RBC, small leuk's   A:  R/O UTI  Normal pelvic exam  P: Reviewed findings of UTI and need for treatment. Rx:  Macrobid 100 mg to use BID until C&S report then PC prn. [id number] micro, culture Reviewed warning signs and symptoms of UTI and need to advise if occurring. Encouraged to limit soda, tea, and coffee She will start back to using the coconut oil daily   RV prn

## 2016-03-17 NOTE — Telephone Encounter (Signed)
Spoke with patient. Advised of message as seen below from Kem Boroughs, [place]. Patient is very upset. States she was not aware Melvia Heaps CNM was out of the office today and would like me to speak with Melvia Heaps CNM tomorrow morning. Requests to speak with Melvia Heaps CNM directly. Advised I can send a message to [place], but cannot guarantee a time of return call. Advised best way to receive assessment and proper treatment is to be seen. Offered patient an appointment today at 4 pm with Kem Boroughs, FNP. Patient declines to schedule. Also states she is upset that she was not contacted at her work number. Advised patient contacted work number, but there was no answer and answering machine was public. Did not leave message as there is no signed release to leave message at this number and was trying to protect her privacy. Advised immediately contacted her mobile number and left voicemail. Patient verbalizes understanding. Aware RN will speak with Melvia Heaps CNM first thing in the morning, but that assessment is recommended to prevent worsening symptoms. Patient agreeable to be seen today at 4 pm. Appointment scheduled with Kem Boroughs, FNP.  Cc: Melvia Heaps CNM  Cc: Dr.Miller  Routing to covering provider for final review. Patient agreeable to disposition. Will close encounter.

## 2016-03-17 NOTE — Telephone Encounter (Signed)
On chart review to see if chronic history of UTI (since I do not know her personally):   I see that urine was completely negative for 1/15, 1/16, 1/17, 10/17, 1/18.  The cause of symptoms for visit in 10/17 according to chart review looks like cystitis or urethritis along with atrophic vaginal changes.  I would advise her to have OV first.  If not able to come in today then she may try OTC AZO with cranberry prn for comfort measures.  Go back also and use the coconut oil for vaginal dryness.  If she gets worse or not well advise Urgent care visit for after she gets off work.

## 2016-03-17 NOTE — Telephone Encounter (Signed)
Patient called requesting to speak with the nurse. She said, "I think I have urethritis again."  Patient declined an appointment until speaking with the nurse.

## 2016-03-18 LAB — URINALYSIS, MICROSCOPIC ONLY
Bacteria, UA: NONE SEEN [HPF]
Casts: NONE SEEN [LPF]
RBC / HPF: NONE SEEN RBC/HPF (ref <=2)
YEAST: NONE SEEN [HPF]

## 2016-03-18 LAB — URINE CULTURE

## 2016-03-18 NOTE — Progress Notes (Signed)
Encounter reviewed by Dr. Trypp Heckmann Amundson C. Silva.  

## 2016-12-08 ENCOUNTER — Encounter: Payer: Self-pay | Admitting: Certified Nurse Midwife

## 2016-12-09 ENCOUNTER — Other Ambulatory Visit: Payer: Self-pay

## 2016-12-09 DIAGNOSIS — M8580 Other specified disorders of bone density and structure, unspecified site: Secondary | ICD-10-CM

## 2016-12-09 NOTE — Progress Notes (Signed)
Order for BMD placed for patient to call and schedule [facility].

## 2016-12-16 ENCOUNTER — Other Ambulatory Visit: Payer: Self-pay | Admitting: Certified Nurse Midwife

## 2016-12-16 DIAGNOSIS — Z1231 Encounter for screening mammogram for malignant neoplasm of breast: Secondary | ICD-10-CM

## 2017-01-13 ENCOUNTER — Ambulatory Visit: Payer: PRIVATE HEALTH INSURANCE

## 2017-01-13 ENCOUNTER — Other Ambulatory Visit: Payer: PRIVATE HEALTH INSURANCE

## 2017-02-03 HISTORY — PX: APPENDECTOMY: SHX54

## 2017-02-12 ENCOUNTER — Ambulatory Visit
Admission: RE | Admit: 2017-02-12 | Discharge: 2017-02-12 | Disposition: A | Discharge status: Home / Self Care | Payer: PRIVATE HEALTH INSURANCE | Source: Ambulatory Visit | Attending: Certified Nurse Midwife | Admitting: Certified Nurse Midwife

## 2017-02-12 DIAGNOSIS — Z1231 Encounter for screening mammogram for malignant neoplasm of breast: Secondary | ICD-10-CM

## 2017-02-12 DIAGNOSIS — M8580 Other specified disorders of bone density and structure, unspecified site: Secondary | ICD-10-CM

## 2017-03-10 NOTE — Progress Notes (Signed)
62 y.o. G78P1011 Married  Caucasian Fe here for annual exam. Menopausal no HRT. Denies vaginal bleeding or vaginal dryness. Enjoying retirement and still working occasionally.  Sees PCP if needed. No health issues today. No HSV 2 outbreaks, no meds in a long time. No labs today. Son at [employer] [facility] [employer] and now doing cancer research there!    Patient's last menstrual period was 02/04/2004 (approximate).          Sexually active: Yes.    The current method of family planning is post menopausal status & husband vasectomy   Exercising: Yes.    stretching & occ walking Smoker:  no  Health Maintenance: Pap:  02-17-14 neg, 03-05-16 neg History of Abnormal Pap: yes MMG:  02-12-17 category c density birads 1:neg Self Breast exams: yes Colonoscopy:  11/08  due BMD:   2019 TDaP:  2018 Shingles: 2012 Pneumonia: no Hep C and HIV: Hep c neg 2017 Labs: none   reports that she has quit smoking. she has never used smokeless tobacco. She reports that she drinks about 2.4 oz of alcohol per week. She reports that she does not use drugs.  Past Medical History:  Diagnosis Date  . Abnormal Pap smear 05/03/98   ASCUS  . Hiatal hernia 1/11  . Kidney cysts 11/08   no f/u needed (left side)  . STD (sexually transmitted disease)    HSV2    Past Surgical History:  Procedure Laterality Date  . BREAST CYST EXCISION Right    benign (fibroadenoma per pt)  . COLPOSCOPY  2000   atypia, no dysplasia    Current Outpatient Medications  Medication Sig Dispense Refill  . Ascorbic Acid (VITAMIN C PO) Take by mouth.    . Cholecalciferol (VITAMIN D PO) Take 1,000 Int'l Units by mouth daily.     Marland Kitchen MELATONIN PO Take by mouth at bedtime.    . Multiple Vitamins-Minerals (MULTIVITAMIN PO) Take by mouth daily.    . nitrofurantoin, macrocrystal-monohydrate, (MACROBID) 100 MG capsule Twice a day for 7 days then PC prn 30 capsule 0   No current facility-administered medications for this visit.     Family History  Problem Relation  Age of Onset  . Hypertension Father   . Stroke Father     ROS:  Pertinent items are noted in HPI.  Otherwise, a comprehensive ROS was negative.  Exam:   BP 120/76   Pulse 68   Resp 16   Ht 5' 1.25" (1.556 m)   Wt 131 lb (59.4 kg)   LMP 02/04/2004 (Approximate)   BMI 24.55 kg/m  Height: 5' 1.25" (155.6 cm) Ht Readings from Last 3 Encounters:  03/11/17 5' 1.25" (1.556 m)  03/17/16 5' 1.25" (1.556 m)  03/05/16 5' 1.25" (1.556 m)    General appearance: alert, cooperative and appears stated age Head: Normocephalic, without obvious abnormality, atraumatic Neck: no adenopathy, supple, symmetrical, trachea midline and thyroid normal to inspection and palpation Lungs: clear to auscultation bilaterally Breasts: normal appearance, no masses or tenderness, No nipple retraction or dimpling, No nipple discharge or bleeding, No axillary or supraclavicular adenopathy Heart: regular rate and rhythm Abdomen: soft, non-tender; no masses,  no organomegaly Extremities: extremities normal, atraumatic, no cyanosis or edema Skin: Skin color, texture, turgor normal. No rashes or lesions Lymph nodes: Cervical, supraclavicular, and axillary nodes normal. No abnormal inguinal nodes palpated Neurologic: Grossly normal   Pelvic: External genitalia:  no lesions              Urethra:  normal  appearing urethra with no masses, tenderness or lesions              Bartholin's and Skene's: normal                 Vagina: normal appearing vagina with normal color and discharge, no lesions              Cervix: no cervical motion tenderness, no lesions and normal appearance              Pap taken: No. Bimanual Exam:  Uterus:  normal size, contour, position, consistency, mobility, non-tender and anteverted              Adnexa: normal adnexa and no mass, fullness, tenderness               Rectovaginal: Confirms               Anus:  normal sphincter tone, no lesions  Chaperone present: yes  A:  Well Woman with  normal exam  Menopausal no HRT  Vaginal dryness using coconut oil with good results  History of UTI this past year with vaginal dryness, no symptoms today  Colonoscopy due  History of HSV does not need Rx update  P:   Reviewed health and wellness pertinent to exam  Aware of need to advise if vaginal bleeding  Continue coconut oil use as discussed and apply small amount around urethral meatus also  Discussed risks and benefits of colonscopy, declines, but would like to do Cologard, patient will be called with information regarding  Pap smear: no   counseled on breast self exam, mammography screening, feminine hygiene, adequate intake of calcium and vitamin D, diet and exercise  return annually or prn  An After Visit Summary was printed and given to the patient.

## 2017-03-11 ENCOUNTER — Other Ambulatory Visit: Payer: Self-pay

## 2017-03-11 ENCOUNTER — Ambulatory Visit (INDEPENDENT_AMBULATORY_CARE_PROVIDER_SITE_OTHER): Payer: No Typology Code available for payment source | Admitting: Certified Nurse Midwife

## 2017-03-11 ENCOUNTER — Encounter: Payer: Self-pay | Admitting: Certified Nurse Midwife

## 2017-03-11 ENCOUNTER — Telehealth: Payer: Self-pay | Admitting: Certified Nurse Midwife

## 2017-03-11 VITALS — BP 120/76 | HR 68 | Resp 16 | Ht 61.25 in | Wt 131.0 lb

## 2017-03-11 DIAGNOSIS — Z01419 Encounter for gynecological examination (general) (routine) without abnormal findings: Secondary | ICD-10-CM | POA: Diagnosis not present

## 2017-03-11 DIAGNOSIS — N390 Urinary tract infection, site not specified: Secondary | ICD-10-CM | POA: Diagnosis not present

## 2017-03-11 DIAGNOSIS — N951 Menopausal and female climacteric states: Secondary | ICD-10-CM | POA: Diagnosis not present

## 2017-03-11 MED ORDER — NITROFURANTOIN MONOHYD MACRO 100 MG PO CAPS
ORAL_CAPSULE | ORAL | 12 refills | Status: AC
Start: 2017-03-11 — End: ?

## 2017-03-11 NOTE — Addendum Note (Signed)
Addended by: Regina Eck on: 03/11/2017 01:16 PM   Modules accepted: Orders

## 2017-03-11 NOTE — Telephone Encounter (Signed)
Will update in Rx in chart today

## 2017-03-11 NOTE — Telephone Encounter (Signed)
-----   Message from [place], Generic sent at 03/11/2017 10:20 AM EST -----    While at my exam today I forgot to tell Debbi that the Nitrofurantoin pills I have left expire next week. Can I get another prescription for that called in?

## 2017-03-11 NOTE — Telephone Encounter (Signed)
Spoke with patient, advised as seen below per Melvia Heaps, CNM. Patient verbalizes understanding and is agreeable.   Will close encounter.

## 2017-03-11 NOTE — Telephone Encounter (Signed)
Routing to Melvia Heaps, CNM -please advise on Macrobid refill?

## 2017-03-24 ENCOUNTER — Encounter: Payer: Self-pay | Admitting: Certified Nurse Midwife

## 2017-03-24 LAB — COLOGUARD

## 2017-04-01 ENCOUNTER — Telehealth: Payer: Self-pay

## 2017-04-01 DIAGNOSIS — Z1213 Encounter for screening for malignant neoplasm of small intestine: Secondary | ICD-10-CM

## 2017-04-01 DIAGNOSIS — Z1212 Encounter for screening for malignant neoplasm of rectum: Secondary | ICD-10-CM

## 2017-04-01 LAB — COLOGUARD: Cologuard: POSITIVE

## 2017-04-01 NOTE — Telephone Encounter (Signed)
Left message to call Kaitlyn at [phone]. 

## 2017-04-01 NOTE — Telephone Encounter (Signed)
Call to patient. Advised of recommendations from [facility].  Encounter closed.

## 2017-04-01 NOTE — Telephone Encounter (Signed)
Patient returned call to Kaitlyn. °

## 2017-04-01 NOTE — Telephone Encounter (Signed)
Spoke with patient. Advised of positive Cologuard testing and the need for a referral to GI for further evaluation with a colonoscopy. Patient states that she "may proceed" with this. Requesting to read the result report. Copied mailed to patient's home address on file. Advised regardless Melvia Heaps CNM highly recommends seeking evaluation with GI. Patient requests a referral to a GI in [place] or [place]. Does not want to be seen in [place] and does not have a provider she prefers to see. Advised will speak with Melvia Heaps CNM to see if she knows a provider in either of these locations that she would recommend. Patient is agreeable.

## 2017-04-01 NOTE — Telephone Encounter (Signed)
Dr. Acquanetta Sit  [place] [facility]/ [facility] These are the only ones I am familiar with that some of my patients have used

## 2017-04-06 DIAGNOSIS — R195 Other fecal abnormalities: Secondary | ICD-10-CM | POA: Insufficient documentation

## 2017-04-29 HISTORY — PX: COLONOSCOPY: SHX174

## 2017-05-01 ENCOUNTER — Telehealth: Payer: Self-pay | Admitting: Certified Nurse Midwife

## 2017-05-01 NOTE — Telephone Encounter (Signed)
Manuela Schwartz from Dr. Wilber Oliphant office calling to provide report for Melvia Heaps, CNM. Patient seen for colonoscopy on 04/30/17. Patient required referral to general surgery for extended appendectomy/cecal resection d/t residual polyp in orifice. Patient has been notified and is scheduled with Dr. Phineas Douglas at [facility] in [facility] on 05/05/17. Will fax copy of pathology report and OV notes to [phone].    Reports received, placed on Melvia Heaps, CNM desk for review. Per review of Epic, no PCP on file.  Will close encounter.

## 2017-05-01 NOTE — Telephone Encounter (Signed)
Manuela Schwartz at [facility] is calling to give info on this patient.

## 2017-05-01 NOTE — Telephone Encounter (Signed)
Call to patient regarding colonoscopy report and unusual finding. Patient has surgical appointment on 05/05/17. She understands that appendix removal is necessary due to unusual tissue noted on pathology. "so glad I had Cologard and was pleased with Dr. Shana Chute for care". Encourage patient to keep all follow up recommended, which she plans to do. Expressed my thoughts are with her during this time. Will call if has any other problems.

## 2017-05-20 ENCOUNTER — Encounter: Payer: Self-pay | Admitting: Certified Nurse Midwife

## 2017-05-20 ENCOUNTER — Telehealth: Payer: Self-pay | Admitting: Certified Nurse Midwife

## 2017-05-20 NOTE — Telephone Encounter (Signed)
Chart updated to add colonoscopy 04/29/17. See myChart message to patient.    Routing to Melvia Heaps, CNM FYI, will close encounter.

## 2017-05-20 NOTE — Telephone Encounter (Signed)
Patient sent the following correspondence through [facility]. Routing to triage to assist patient with request.  ----- Message from [place], Generic sent at 05/20/2017 11:51 AM EDT -----    Personal for Debbi:    I am scheduled for appendectomy with cuff removal in [facility] on May 13th. This is because they cannot see past the appendiceal orifice on colonoscopy and the lesion is right there. Dr. Phineas Douglas thinks he can probably do it laparoscopically. Let's hope! Unfortunately, I have to do the bowel prep again for surgery.     Also, could you have someone update my chart to say that I had a colonoscopy May 30, 2017.     Thanks Debbi, you're the best!!  Tomasita Crumble

## 2017-06-16 ENCOUNTER — Telehealth: Payer: Self-pay | Admitting: Certified Nurse Midwife

## 2017-06-16 ENCOUNTER — Encounter: Payer: Self-pay | Admitting: Certified Nurse Midwife

## 2017-06-16 NOTE — Telephone Encounter (Signed)
Health maintenance for colonoscopy updated in patient's chart. Routing to [employer] CNM as Juluis Rainier. Encounter closed.

## 2017-06-16 NOTE — Telephone Encounter (Signed)
-----   Message from [place], Generic sent at 06/16/2017 8:40 AM EDT -----    Debi,     I just wanted to let you know that I had the surgery yesterday. He was able to remove the appendix and the cuff laparoscopically, so I got to come home last evening.  I'm doing well, trying not to be my normal self doing too many activities.    The last time I wrote, someone in your office said they would put in my chart that I had a colonoscopy on April 29, 2017. It doesn't seem to be recored in my chart.    Have a good summer and thanks for everything!     Dorian Pod

## 2017-12-07 DIAGNOSIS — K449 Diaphragmatic hernia without obstruction or gangrene: Secondary | ICD-10-CM | POA: Insufficient documentation

## 2017-12-07 DIAGNOSIS — N281 Cyst of kidney, acquired: Secondary | ICD-10-CM | POA: Insufficient documentation

## 2017-12-07 DIAGNOSIS — B009 Herpesviral infection, unspecified: Secondary | ICD-10-CM | POA: Insufficient documentation

## 2017-12-07 DIAGNOSIS — D241 Benign neoplasm of right breast: Secondary | ICD-10-CM | POA: Insufficient documentation

## 2017-12-07 DIAGNOSIS — K635 Polyp of colon: Secondary | ICD-10-CM | POA: Insufficient documentation

## 2018-02-05 ENCOUNTER — Other Ambulatory Visit: Payer: Self-pay | Admitting: Certified Nurse Midwife

## 2018-02-05 DIAGNOSIS — Z1231 Encounter for screening mammogram for malignant neoplasm of breast: Secondary | ICD-10-CM

## 2018-02-16 ENCOUNTER — Ambulatory Visit
Admission: RE | Admit: 2018-02-16 | Discharge: 2018-02-16 | Disposition: A | Discharge status: Home / Self Care | Payer: PRIVATE HEALTH INSURANCE | Source: Ambulatory Visit

## 2018-02-16 DIAGNOSIS — Z1231 Encounter for screening mammogram for malignant neoplasm of breast: Secondary | ICD-10-CM

## 2018-02-26 ENCOUNTER — Telehealth: Payer: Self-pay | Admitting: Certified Nurse Midwife

## 2018-02-26 NOTE — Telephone Encounter (Signed)
Shingrix vaccines updated.  Responded to patient via mychart.  Encounter closed.

## 2018-02-26 NOTE — Telephone Encounter (Signed)
Patient sent the following message through [facility]. Routing to triage to assist patient with request.   Please update records to reflect shingrix vaccinations done 12/10/17 and 02/26/18. Image attached.    Thank you

## 2018-03-25 ENCOUNTER — Other Ambulatory Visit (HOSPITAL_COMMUNITY)
Admission: RE | Admit: 2018-03-25 | Discharge: 2018-03-25 | Disposition: A | Discharge status: Home / Self Care | Payer: PRIVATE HEALTH INSURANCE | Source: Ambulatory Visit | Attending: Obstetrics & Gynecology | Admitting: Obstetrics & Gynecology

## 2018-03-25 ENCOUNTER — Encounter: Payer: Self-pay | Admitting: Certified Nurse Midwife

## 2018-03-25 ENCOUNTER — Ambulatory Visit (INDEPENDENT_AMBULATORY_CARE_PROVIDER_SITE_OTHER): Payer: No Typology Code available for payment source | Admitting: Certified Nurse Midwife

## 2018-03-25 ENCOUNTER — Other Ambulatory Visit: Payer: Self-pay

## 2018-03-25 VITALS — BP 118/62 | HR 68 | Resp 16 | Ht 61.25 in | Wt 130.0 lb

## 2018-03-25 DIAGNOSIS — N951 Menopausal and female climacteric states: Secondary | ICD-10-CM

## 2018-03-25 DIAGNOSIS — Z124 Encounter for screening for malignant neoplasm of cervix: Secondary | ICD-10-CM

## 2018-03-25 DIAGNOSIS — Z01419 Encounter for gynecological examination (general) (routine) without abnormal findings: Secondary | ICD-10-CM | POA: Diagnosis not present

## 2018-03-25 NOTE — Progress Notes (Signed)
63 y.o. G26P1011 Married  Caucasian Fe here for annual exam. Menopausal no HRT. Denies vaginal bleeding or vaginal dryness.Continues with coconut oil use for vaginal dryness, working well. Enjoying retirement and quilting/and art work. Some urinary frequency/urgency at times but related to amount of fluid intake. No UTI symptoms.. Had appendectomy due to type of polyp found.. Saw GI Dr. Shana Chute for colonoscopy who found the polyp. Will need to repeat colonoscopy in 2 years. Saw Dr. Sue Lush PCP for aex/labs in 11/19 and was happy with choice. No health issues today. Son who I delivered is 36!  Patient's last menstrual period was 02/04/2004 (approximate).          Sexually active: Yes.    The current method of family planning is vasectomy.    Exercising: Yes.    stretching Smoker:  no  Review of Systems  Constitutional: Negative.   HENT: Negative.   Eyes: Negative.   Respiratory: Negative.   Cardiovascular: Negative.   Gastrointestinal: Negative.   Genitourinary: Negative.   Musculoskeletal: Negative.   Skin: Negative.   Neurological: Negative.   Endo/Heme/Allergies: Negative.   Psychiatric/Behavioral: Negative.     Health Maintenance: Pap:  03-05-16 neg History of Abnormal Pap: yes MMG:  02-16-2018 category b density birads 1:neg Self Breast exams: yes Colonoscopy:  2019 f/u 3yr BMD:   2019 TDaP:  2018 Shingles: 2020 Pneumonia: 2019 Hep C and HIV: Hep c neg 2017 Labs: with PCP   reports that she has quit smoking. She has never used smokeless tobacco. She reports current alcohol use of about 4.0 standard drinks of alcohol per week. She reports that she does not use drugs.  Past Medical History:  Diagnosis Date  . Abnormal Pap smear 05/03/98   ASCUS  . Hiatal hernia 1/11  . Kidney cysts 11/08   no f/u needed (left side)  . STD (sexually transmitted disease)    HSV2    Past Surgical History:  Procedure Laterality Date  . BREAST CYST EXCISION Right    benign (fibroadenoma  per pt)  . COLONOSCOPY N/A 04/29/2017   per patient  . COLPOSCOPY  2000   atypia, no dysplasia    Current Outpatient Medications  Medication Sig Dispense Refill  . Ascorbic Acid (VITAMIN C PO) Take by mouth.    . Cholecalciferol (VITAMIN D PO) Take 1,000 Int'l Units by mouth daily.     Marland Kitchen MELATONIN PO Take by mouth at bedtime.    . Multiple Vitamins-Minerals (MULTIVITAMIN PO) Take by mouth daily.    . nitrofurantoin, macrocrystal-monohydrate, (MACROBID) 100 MG capsule Twice a day for 7 days if symptomatic only, post coital as needed 30 capsule 12   No current facility-administered medications for this visit.     Family History  Problem Relation Age of Onset  . Hypertension Father   . Stroke Father     ROS:  Pertinent items are noted in HPI.  Otherwise, a comprehensive ROS was negative.  Exam:   LMP 02/04/2004 (Approximate)    Ht Readings from Last 3 Encounters:  03/11/17 5' 1.25" (1.556 m)  03/17/16 5' 1.25" (1.556 m)  03/05/16 5' 1.25" (1.556 m)    General appearance: alert, cooperative and appears stated age Head: Normocephalic, without obvious abnormality, atraumatic Neck: no adenopathy, supple, symmetrical, trachea midline and thyroid normal to inspection and palpation Lungs: clear to auscultation bilaterally Breasts: normal appearance, no masses or tenderness, No nipple retraction or dimpling, No nipple discharge or bleeding, No axillary or supraclavicular adenopathy Heart: regular rate and  rhythm Abdomen: soft, non-tender; no masses,  no organomegaly Extremities: extremities normal, atraumatic, no cyanosis or edema Skin: Skin color, texture, turgor normal. No rashes or lesions Lymph nodes: Cervical, supraclavicular, and axillary nodes normal. No abnormal inguinal nodes palpated Neurologic: Grossly normal   Pelvic: External genitalia:  no lesions              Urethra:  normal appearing urethra with no masses, tenderness or lesions              Bartholin's and  Skene's: normal                 Vagina: normal appearing vagina with normal color and discharge, no lesions              Cervix: multiparous appearance, no bleeding following Pap and no cervical motion tenderness              Pap taken: Yes.   Bimanual Exam:  Uterus:  normal size, contour, position, consistency, mobility, non-tender              Adnexa: normal adnexa and no mass, fullness, tenderness               Rectovaginal: Confirms               Anus:  normal sphincter tone, no lesions  Chaperone present: yes  A:  Well Woman with normal exam  Menopausal no HRT  Appendectomy due to sessile colonic polyp, benign  HSV history no outbreaks in a long time, no medication update needed  P:   Reviewed health and wellness pertinent to exam  Aware of need to advise if vaginal bleeding  Continue follow up as indicated  Will advise if she needs Rx.  Pap smear: yes   counseled on breast self exam, mammography screening, feminine hygiene, menopause, adequate intake of calcium and vitamin D, diet and exercise, Kegel's exercises return annually or prn  An After Visit Summary was printed and given to the patient.

## 2018-03-25 NOTE — Patient Instructions (Signed)

## 2018-03-26 LAB — CYTOLOGY - PAP
DIAGNOSIS: NEGATIVE
HPV: NOT DETECTED

## 2018-11-03 DIAGNOSIS — Z87828 Personal history of other (healed) physical injury and trauma: Secondary | ICD-10-CM | POA: Insufficient documentation

## 2018-11-04 DIAGNOSIS — M25561 Pain in right knee: Secondary | ICD-10-CM | POA: Insufficient documentation

## 2019-02-25 ENCOUNTER — Other Ambulatory Visit: Payer: Self-pay | Admitting: Certified Nurse Midwife

## 2019-02-25 DIAGNOSIS — Z1231 Encounter for screening mammogram for malignant neoplasm of breast: Secondary | ICD-10-CM

## 2019-03-02 ENCOUNTER — Ambulatory Visit
Admission: RE | Admit: 2019-03-02 | Discharge: 2019-03-02 | Disposition: A | Discharge status: Home / Self Care | Payer: Managed Care, Other (non HMO) | Source: Ambulatory Visit

## 2019-03-02 ENCOUNTER — Other Ambulatory Visit: Payer: Self-pay

## 2019-03-02 DIAGNOSIS — Z1231 Encounter for screening mammogram for malignant neoplasm of breast: Secondary | ICD-10-CM

## 2019-03-28 ENCOUNTER — Other Ambulatory Visit: Payer: Self-pay

## 2019-03-29 NOTE — Progress Notes (Signed)
64 y.o. G1P1011 Married  Caucasian Fe here for annual exam. Post menopausal no HRT. Denies vaginal bleeding or vaginal dryness. Planning for colonoscopy this year. Retired this year! Enjoying quilting and being with spouse. Has established with PCP who is female. She plans to have her next Gyn visit there. Has appreciated all the care received here. Son is 24 now.  Patient's last menstrual period was 02/04/2004 (approximate).          Sexually active: Yes.    The current method of family planning is vasectomy.    Exercising: Yes.    stretching & stregthening exercises Smoker:  no  Review of Systems  Constitutional: Negative.   HENT: Negative.   Eyes: Negative.   Respiratory: Negative.   Cardiovascular: Negative.   Gastrointestinal: Negative.   Genitourinary: Negative.   Musculoskeletal: Negative.   Skin: Negative.   Neurological: Negative.   Endo/Heme/Allergies: Negative.   Psychiatric/Behavioral: Negative.     Health Maintenance: Pap:  03-05-16 neg, 03-25-2018 neg HPV HR neg History of Abnormal Pap: yes MMG:  03-02-2019 category b density birads 1:neg Self Breast exams: yes Colonoscopy:  2019 f/u 93yr, not done BMD:   2019 TDaP:  2018 Shingles: 2020 Pneumonia: 2019 Hep C and HIV: hep c neg 2017 Labs: no   reports that she has quit smoking. She has never used smokeless tobacco. She reports current alcohol use of about 5.0 standard drinks of alcohol per week. She reports that she does not use drugs.  Past Medical History:  Diagnosis Date  . Abnormal Pap smear 05/03/98   ASCUS  . Hiatal hernia 1/11  . Kidney cysts 11/08   no f/u needed (left side)  . STD (sexually transmitted disease)    HSV2    Past Surgical History:  Procedure Laterality Date  . APPENDECTOMY  2019  . BREAST CYST EXCISION Right    benign (fibroadenoma per pt)  . COLONOSCOPY N/A 04/29/2017   per patient  . COLPOSCOPY  2000   atypia, no dysplasia    Current Outpatient Medications  Medication Sig  Dispense Refill  . Ascorbic Acid (VITAMIN C PO) Take by mouth.    . Cholecalciferol (VITAMIN D PO) Take 1,000 Int'l Units by mouth daily.     Marland Kitchen MELATONIN PO Take by mouth at bedtime.    . Multiple Vitamins-Minerals (MULTIVITAMIN PO) Take by mouth daily.    . nitrofurantoin, macrocrystal-monohydrate, (MACROBID) 100 MG capsule Twice a day for 7 days if symptomatic only, post coital as needed 30 capsule 12   No current facility-administered medications for this visit.    Family History  Problem Relation Age of Onset  . Hypertension Father   . Stroke Father     ROS:  Pertinent items are noted in HPI.  Otherwise, a comprehensive ROS was negative.  Exam:   LMP 02/04/2004 (Approximate)    Ht Readings from Last 3 Encounters:  03/25/18 5' 1.25" (1.556 m)  03/11/17 5' 1.25" (1.556 m)  03/17/16 5' 1.25" (1.556 m)    General appearance: alert, cooperative and appears stated age Head: Normocephalic, without obvious abnormality, atraumatic Neck: no adenopathy, supple, symmetrical, trachea midline and thyroid normal to inspection and palpation Lungs: clear to auscultation bilaterally Breasts: normal appearance, no masses or tenderness, No nipple retraction or dimpling, No nipple discharge or bleeding, No axillary or supraclavicular adenopathy Heart: regular rate and rhythm Abdomen: soft, non-tender; no masses,  no organomegaly Extremities: extremities normal, atraumatic, no cyanosis or edema Skin: Skin color, texture, turgor normal. No  rashes or lesions Lymph nodes: Cervical, supraclavicular, and axillary nodes normal. No abnormal inguinal nodes palpated Neurologic: Grossly normal   Pelvic: External genitalia:  no lesions              Urethra:  normal appearing urethra with no masses, tenderness or lesions              Bartholin's and Skene's: normal                 Vagina: normal appearing vagina with normal color and discharge, no lesions              Cervix: no cervical motion  tenderness and no lesions              Pap taken: No. Bimanual Exam:  Uterus:  normal size, contour, position, consistency, mobility, non-tender and anteverted              Adnexa: normal adnexa and no mass, fullness, tenderness               Rectovaginal: Confirms               Anus:  normal sphincter tone, no lesions  Chaperone present: yes  A:  Well Woman with normal exam  Menopausal no HRT  History of HSV 2, declines medication   Colonoscopy due this year  P:   Reviewed health and wellness pertinent to exam  Aware of need to advise if vaginal bleeding  Will call if Rx needed  Discussed recommendation, patient aware and plans to schedule soon.  Pap smear: no   counseled on breast self exam, mammography screening, feminine hygiene, menopause, adequate intake of calcium and vitamin D, diet and exercise  return annually or prn  An After Visit Summary was printed and given to the patient.

## 2019-03-30 ENCOUNTER — Ambulatory Visit (INDEPENDENT_AMBULATORY_CARE_PROVIDER_SITE_OTHER): Payer: Managed Care, Other (non HMO) | Admitting: Certified Nurse Midwife

## 2019-03-30 ENCOUNTER — Other Ambulatory Visit: Payer: Self-pay

## 2019-03-30 ENCOUNTER — Encounter: Payer: Self-pay | Admitting: Certified Nurse Midwife

## 2019-03-30 VITALS — BP 108/60 | HR 76 | Temp 98.6°F | Resp 16 | Ht 60.75 in | Wt 129.0 lb

## 2019-03-30 DIAGNOSIS — Z01419 Encounter for gynecological examination (general) (routine) without abnormal findings: Secondary | ICD-10-CM | POA: Diagnosis not present

## 2019-03-30 NOTE — Patient Instructions (Signed)

## 2019-04-26 ENCOUNTER — Encounter: Payer: Self-pay | Admitting: Certified Nurse Midwife
